# Patient Record
Sex: Female | Born: 1995 | Race: White | Hispanic: No | Marital: Single | Smoking: Never smoker
Health system: Southern US, Community
[De-identification: ages and names within clinical notes are randomized; demographics above are authoritative.]

## PROBLEM LIST (undated history)

## (undated) ENCOUNTER — Emergency Department (HOSPITAL_BASED_OUTPATIENT_CLINIC_OR_DEPARTMENT_OTHER): Admission: EM | Payer: BC Managed Care – PPO

## (undated) DIAGNOSIS — M797 Fibromyalgia: Secondary | ICD-10-CM

## (undated) DIAGNOSIS — F32A Depression, unspecified: Secondary | ICD-10-CM

## (undated) DIAGNOSIS — F419 Anxiety disorder, unspecified: Secondary | ICD-10-CM

## (undated) DIAGNOSIS — F329 Major depressive disorder, single episode, unspecified: Secondary | ICD-10-CM

## (undated) HISTORY — DX: Fibromyalgia: M79.7

---
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
## 2017-04-08 DIAGNOSIS — Z6820 Body mass index (BMI) 20.0-20.9, adult: Secondary | ICD-10-CM | POA: Diagnosis not present

## 2017-04-08 DIAGNOSIS — Z01419 Encounter for gynecological examination (general) (routine) without abnormal findings: Secondary | ICD-10-CM | POA: Diagnosis not present

## 2017-04-08 DIAGNOSIS — Z113 Encounter for screening for infections with a predominantly sexual mode of transmission: Secondary | ICD-10-CM | POA: Diagnosis not present

## 2017-04-08 DIAGNOSIS — Z30018 Encounter for initial prescription of other contraceptives: Secondary | ICD-10-CM | POA: Diagnosis not present

## 2017-04-08 DIAGNOSIS — Z118 Encounter for screening for other infectious and parasitic diseases: Secondary | ICD-10-CM | POA: Diagnosis not present

## 2017-09-09 DIAGNOSIS — F411 Generalized anxiety disorder: Secondary | ICD-10-CM | POA: Diagnosis not present

## 2017-09-09 DIAGNOSIS — F902 Attention-deficit hyperactivity disorder, combined type: Secondary | ICD-10-CM | POA: Diagnosis not present

## 2017-09-09 DIAGNOSIS — F3181 Bipolar II disorder: Secondary | ICD-10-CM | POA: Diagnosis not present

## 2017-11-27 DIAGNOSIS — F902 Attention-deficit hyperactivity disorder, combined type: Secondary | ICD-10-CM | POA: Diagnosis not present

## 2017-11-27 DIAGNOSIS — F3181 Bipolar II disorder: Secondary | ICD-10-CM | POA: Diagnosis not present

## 2017-11-27 DIAGNOSIS — F411 Generalized anxiety disorder: Secondary | ICD-10-CM | POA: Diagnosis not present

## 2018-01-23 DIAGNOSIS — F902 Attention-deficit hyperactivity disorder, combined type: Secondary | ICD-10-CM | POA: Diagnosis not present

## 2018-01-23 DIAGNOSIS — F411 Generalized anxiety disorder: Secondary | ICD-10-CM | POA: Diagnosis not present

## 2018-01-23 DIAGNOSIS — F3181 Bipolar II disorder: Secondary | ICD-10-CM | POA: Diagnosis not present

## 2018-06-24 DIAGNOSIS — F902 Attention-deficit hyperactivity disorder, combined type: Secondary | ICD-10-CM | POA: Diagnosis not present

## 2018-06-24 DIAGNOSIS — F3181 Bipolar II disorder: Secondary | ICD-10-CM | POA: Diagnosis not present

## 2018-06-24 DIAGNOSIS — F411 Generalized anxiety disorder: Secondary | ICD-10-CM | POA: Diagnosis not present

## 2018-08-12 DIAGNOSIS — Z0000 Encounter for general adult medical examination without abnormal findings: Secondary | ICD-10-CM | POA: Diagnosis not present

## 2018-08-12 DIAGNOSIS — M545 Low back pain: Secondary | ICD-10-CM | POA: Diagnosis not present

## 2018-08-12 DIAGNOSIS — Z309 Encounter for contraceptive management, unspecified: Secondary | ICD-10-CM | POA: Diagnosis not present

## 2018-08-12 DIAGNOSIS — Z130 Encounter for screening for diseases of the blood and blood-forming organs and certain disorders involving the immune mechanism: Secondary | ICD-10-CM | POA: Diagnosis not present

## 2018-08-12 DIAGNOSIS — Z13228 Encounter for screening for other metabolic disorders: Secondary | ICD-10-CM | POA: Diagnosis not present

## 2018-08-12 DIAGNOSIS — F3342 Major depressive disorder, recurrent, in full remission: Secondary | ICD-10-CM | POA: Diagnosis not present

## 2018-08-12 DIAGNOSIS — Z113 Encounter for screening for infections with a predominantly sexual mode of transmission: Secondary | ICD-10-CM | POA: Diagnosis not present

## 2018-08-12 DIAGNOSIS — J4520 Mild intermittent asthma, uncomplicated: Secondary | ICD-10-CM | POA: Diagnosis not present

## 2018-08-12 DIAGNOSIS — F988 Other specified behavioral and emotional disorders with onset usually occurring in childhood and adolescence: Secondary | ICD-10-CM | POA: Diagnosis not present

## 2018-09-22 DIAGNOSIS — F902 Attention-deficit hyperactivity disorder, combined type: Secondary | ICD-10-CM | POA: Diagnosis not present

## 2018-09-22 DIAGNOSIS — F3181 Bipolar II disorder: Secondary | ICD-10-CM | POA: Diagnosis not present

## 2018-09-22 DIAGNOSIS — F411 Generalized anxiety disorder: Secondary | ICD-10-CM | POA: Diagnosis not present

## 2018-12-15 DIAGNOSIS — F902 Attention-deficit hyperactivity disorder, combined type: Secondary | ICD-10-CM | POA: Diagnosis not present

## 2018-12-15 DIAGNOSIS — F3181 Bipolar II disorder: Secondary | ICD-10-CM | POA: Diagnosis not present

## 2018-12-15 DIAGNOSIS — F411 Generalized anxiety disorder: Secondary | ICD-10-CM | POA: Diagnosis not present

## 2019-01-12 DIAGNOSIS — F3181 Bipolar II disorder: Secondary | ICD-10-CM | POA: Diagnosis not present

## 2019-01-12 DIAGNOSIS — F411 Generalized anxiety disorder: Secondary | ICD-10-CM | POA: Diagnosis not present

## 2019-01-12 DIAGNOSIS — F902 Attention-deficit hyperactivity disorder, combined type: Secondary | ICD-10-CM | POA: Diagnosis not present

## 2019-07-06 ENCOUNTER — Other Ambulatory Visit: Payer: Self-pay

## 2019-07-06 ENCOUNTER — Telehealth (INDEPENDENT_AMBULATORY_CARE_PROVIDER_SITE_OTHER): Payer: BLUE CROSS/BLUE SHIELD | Admitting: Family Medicine

## 2019-07-06 DIAGNOSIS — Z30015 Encounter for initial prescription of vaginal ring hormonal contraceptive: Secondary | ICD-10-CM | POA: Diagnosis not present

## 2019-07-06 DIAGNOSIS — F988 Other specified behavioral and emotional disorders with onset usually occurring in childhood and adolescence: Secondary | ICD-10-CM | POA: Diagnosis not present

## 2019-07-06 DIAGNOSIS — F411 Generalized anxiety disorder: Secondary | ICD-10-CM

## 2019-07-06 MED ORDER — ETONOGESTREL-ETHINYL ESTRADIOL 0.12-0.015 MG/24HR VA RING: VAGINAL_RING | VAGINAL | 3 refills | Status: DC

## 2019-07-07 DIAGNOSIS — F411 Generalized anxiety disorder: Secondary | ICD-10-CM | POA: Insufficient documentation

## 2019-07-07 DIAGNOSIS — F988 Other specified behavioral and emotional disorders with onset usually occurring in childhood and adolescence: Secondary | ICD-10-CM | POA: Insufficient documentation

## 2019-07-07 NOTE — Progress Notes (Signed)
Virtual Visit via Video Note   I connected with Mary Bright on 07/06/19 by a video enabled telemedicine application and verified that I am speaking with the correct person using two identifiers.  Location patient: home Location provider:work office Persons participating in the virtual visit: patient, provider  I discussed the limitations of evaluation and management by telemedicine and the availability of in person appointments. The patient expressed understanding and agreed to proceed.   HPI: Mary Bright is a 23 yo female with Hx of depression,GAD, asthma,and ADD who is establishing care today. Former pt of Dr Shawna Orleans. Last CPE 08/12/2018 Today she is requesting refills on Nuvaring. She has used Nuvaring intermittently since age 57.It has helped with dysmenorrhea and for contraception.  She has tolerated medication well.   Sexually active. LMP a week ago.  She has had 2 pap smears, denies abnormalities.  She follows with psychiatrist , currently she is on Prozac and Adderall XR 15 mg   ROS: See pertinent positives and negatives per HPI.  Past Medical History:  Diagnosis Date  . Anxiety   . Asthma   . Depression    History of psych admission at age 89    Past Surgical History:  Procedure Laterality Date  . HAND SURGERY Right 2015    Family History  Problem Relation Age of Onset  . Depression Mother   . Bipolar disorder Maternal Aunt   . Bipolar disorder Maternal Grandfather   . Alcohol abuse Paternal Grandfather     Social History   Socioeconomic History  . Marital status: Single    Spouse name: Not on file  . Number of children: Not on file  . Years of education: Not on file  . Highest education level: Not on file  Occupational History  . Not on file  Social Needs  . Financial resource strain: Not on file  . Food insecurity    Worry: Not on file    Inability: Not on file  . Transportation needs    Medical: Not on file    Non-medical: Not on file  Tobacco Use  .  Smoking status: Never Smoker  Substance and Sexual Activity  . Alcohol use: Yes  . Drug use: No  . Sexual activity: Not on file  Lifestyle  . Physical activity    Days per week: Not on file    Minutes per session: Not on file  . Stress: Not on file  Relationships  . Social Herbalist on phone: Not on file    Gets together: Not on file    Attends religious service: Not on file    Active member of club or organization: Not on file    Attends meetings of clubs or organizations: Not on file    Relationship status: Not on file  . Intimate partner violence    Fear of current or ex partner: Not on file    Emotionally abused: Not on file    Physically abused: Not on file    Forced sexual activity: Not on file  Other Topics Concern  . 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  albuterol (PROVENTIL HFA;VENTOLIN HFA) 108 (90 BASE) MCG/ACT inhaler, Inhale 2 puffs into the lungs every 6 (six) hours as needed for wheezing or shortness of breath., Disp: 1 Inhaler, Rfl: 3 .  albuterol (PROVENTIL) (  2.5 MG/3ML) 0.083% nebulizer solution, Take 3 mLs (2.5 mg total) by nebulization every 6 (six) hours as needed for wheezing or shortness of breath., Disp: 150 mL, Rfl: 1 .  etonogestrel-ethinyl estradiol (NUVARING) 0.12-0.015 MG/24HR vaginal ring, Insert vaginally and leave in place for 3 consecutive weeks, then remove for 1 week., Disp: 4 each, Rfl: 3  EXAM:  VITALS per patient if applicable:LMP 06/30/2019   GENERAL: alert, oriented, appears well and in no acute distress  HEENT: atraumatic, conjunctiva clear, no obvious abnormalities on inspection.  LUNGS: on inspection no signs of respiratory distress, breathing rate appears normal, no obvious gross SOB, gasping or wheezing  CV: no obvious cyanosis  MS: moves all visible extremities without  noticeable abnormality  PSYCH/NEURO: pleasant and cooperative, no obvious depression or anxiety, speech and thought processing grossly intact  ASSESSMENT AND PLAN:  Discussed the following assessment and plan:  Encounter for initial prescription of vaginal ring hormonal contraceptive - Plan: etonogestrel-ethinyl estradiol (NUVARING) 0.12-0.015 MG/24HR vaginal ring She understands side effects of medication, she has tolerated it well.  Reporting pap smear as current. F/U annually.  GAD (generalized anxiety disorder) No changes in Prozac dose. Continue following with psychiatrist.  Attention deficit disorder, unspecified hyperactivity presence Following with psychiatrist.    I discussed the assessment and treatment plan with the patient. She was provided an opportunity to ask questions and all were answered. She agreed with the plan and demonstrated an understanding of the instructions.     Return in about 1 year (around 07/05/2020) for cpe.    Tyshay Adee [place], MD

## 2019-07-22 DIAGNOSIS — Z20828 Contact with and (suspected) exposure to other viral communicable diseases: Secondary | ICD-10-CM | POA: Diagnosis not present

## 2019-09-17 ENCOUNTER — Other Ambulatory Visit: Payer: Self-pay

## 2019-09-17 ENCOUNTER — Ambulatory Visit: Payer: Self-pay

## 2019-09-17 ENCOUNTER — Encounter (HOSPITAL_COMMUNITY): Payer: Self-pay

## 2019-09-17 ENCOUNTER — Ambulatory Visit (HOSPITAL_COMMUNITY)
Admission: EM | Admit: 2019-09-17 | Discharge: 2019-09-17 | Disposition: A | Discharge status: Home / Self Care | Payer: BC Managed Care – PPO | Attending: Internal Medicine | Admitting: Internal Medicine

## 2019-09-17 DIAGNOSIS — J4521 Mild intermittent asthma with (acute) exacerbation: Secondary | ICD-10-CM

## 2019-09-17 DIAGNOSIS — U071 COVID-19: Secondary | ICD-10-CM | POA: Diagnosis not present

## 2019-09-17 MED ORDER — ALBUTEROL SULFATE (2.5 MG/3ML) 0.083% IN NEBU: 2.5000 mg | INHALATION_SOLUTION | Freq: Four times a day (QID) | RESPIRATORY_TRACT | 1 refills | Status: DC | PRN

## 2019-09-17 MED ORDER — BENZONATATE 100 MG PO CAPS: 100.0000 mg | ORAL_CAPSULE | Freq: Three times a day (TID) | ORAL | 0 refills | Status: DC

## 2019-09-17 NOTE — Telephone Encounter (Signed)
Patient has arrived [facility]

## 2019-09-17 NOTE — Telephone Encounter (Signed)
Incoming call from Patient with a complaint of SOB.  Began  2 night ago . State ?SO?B is constant.  Reports cough and runny nose. Patient is on Isolation for covid -19.   Reviewed protocol  Recommended that Patient be evaluated at Urgent care.  Patient voiced understanding.  Will go to Urgent care on [facility].       Reason for Disposition . [1] MODERATE longstanding difficulty breathing (e.g., speaks in phrases, SOB even at rest, pulse 100-120) AND [2] SAME as normal  Answer Assessment - Initial Assessment Questions 1. RESPIRATORY STATUS: "Describe your breathing?" (e.g., wheezing, shortness of breath, unable to speak, severe coughing)      Shortness of Breath 2. ONSET: "When did this breathing problem begin?"      2 night  3. PATTERN "Does the difficult breathing come and go, or has it been constant since it started?"      constant 4. SEVERITY: "How bad is your breathing?" (e.g., mild, moderate, severe)    - MILD: No SOB at rest, mild SOB with walking, speaks normally in sentences, can lay down, no retractions, pulse < 100.    - MODERATE: SOB at rest, SOB with minimal exertion and prefers to sit, cannot lie down flat, speaks in phrases, mild retractions, audible wheezing, pulse 100-120.    - SEVERE: Very SOB at rest, speaks in single words, struggling to breathe, sitting hunched forward, retractions, pulse > 120     moderate 5. RECURRENT SYMPTOM: "Have you had difficulty breathing before?" If so, ask: "When was the last time?" and "What happened that time?"       6. CARDIAC HISTORY: "Do you have any history of heart disease?" (e.g., heart attack, angina, bypass surgery, angioplasty)      Denies  7. LUNG HISTORY: "Do you have any history of lung disease?"  (e.g., pulmonary embolus, asthma, emphysema)   asthma 8. CAUSE: "What do you think is causing the breathing problem?"      *No Answer* 9. OTHER SYMPTOMS: "Do you have any other symptoms? (e.g., dizziness, runny nose, cough, chest  pain, fever)     Runny nose cough 10. PREGNANCY: "Is there any chance you are pregnant?" "When was your last menstrual period?"      [id number]. TRAVEL: "Have you traveled out of the country in the last month?" (e.g., travel history, exposures)       Denies  Protocols used: BREATHING DIFFICULTY-A-AH

## 2019-09-17 NOTE — ED Triage Notes (Signed)
Patient presents to Urgent Care with complaints of shortness of breath since testing positive for COVID 3 days ago. Patient reports she has an inhaler and nebulizer machine at home but is out of nebulizer medicine,states a refill would be helpful.

## 2019-09-17 NOTE — Telephone Encounter (Signed)
See note

## 2019-09-17 NOTE — ED Provider Notes (Signed)
[facility]    CSN: [id number] Arrival date & time: 09/17/19  1132      History   Chief Complaint Chief Complaint  Patient presents with  . Shortness of Breath    Positive COVID    HPI Mary Bright is a 23 y.o. female who was diagnosed with COVID-19 infection 3 days ago comes to urgent care with complaints of worsening shortness of breath, wheezing and a cough.  Patient denies any fever or chills.  No chest pain or chest pressure.  She has some chest tightness.  Patient currently uses albuterol nebulizer solutions and an albuterol inhaler.  She is run out of albuterol nebulizing solution.  No fever or chills.  No nausea, vomiting or diarrhea.  No loss of taste or smell.  Patient comes to urgent care to be reevaluated.   HPI  Past Medical History:  Diagnosis Date  . Anxiety   . Asthma   . Depression    History of psych admission at age 84    Patient Active Problem List   Diagnosis Date Noted  . GAD (generalized anxiety disorder) 07/07/2019  . ADD (attention deficit disorder) 07/07/2019  . Childhood asthma 03/31/2014  . Enlarged lymph node 03/31/2014  . Dysmenorrhea 03/31/2014  . Depression 12/17/2011    Past Surgical History:  Procedure Laterality Date  . HAND SURGERY Right 2015    OB History   No obstetric history on file.      Home Medications    Prior to Admission medications   Medication Sig Start Date End Date Taking? Authorizing Provider  albuterol (PROVENTIL HFA;VENTOLIN HFA) 108 (90 BASE) MCG/ACT inhaler Inhale 2 puffs into the lungs every 6 (six) hours as needed for wheezing or shortness of breath. 03/31/14   Artist Pais, Doe-Hyun R, DO  albuterol (PROVENTIL) (2.5 MG/3ML) 0.083% nebulizer solution Take 3 mLs (2.5 mg total) by nebulization every 6 (six) hours as needed for wheezing or shortness of breath. 09/17/19 10/17/19  LampteyBritta Mccreedy, MD  benzonatate (TESSALON) 100 MG capsule Take 1 capsule (100 mg total) by mouth every 8 (eight) hours. 09/17/19    Merrilee Jansky, MD  etonogestrel-ethinyl estradiol (NUVARING) 0.12-0.015 MG/24HR vaginal ring Insert vaginally and leave in place for 3 consecutive weeks, then remove for 1 week. 07/06/19   [place], Betty G, MD    Family History Family History  Problem Relation Age of Onset  . Depression Mother   . Bipolar disorder Maternal Aunt   . Bipolar disorder Maternal Grandfather   . Alcohol abuse Paternal Grandfather   . Healthy Father     Social History Social History   Tobacco Use  . Smoking status: Never Smoker  . Smokeless tobacco: Never Used  Substance Use Topics  . Alcohol use: Yes    Comment: socially  . Drug use: No     Allergies   Patient has no known allergies.   Review of Systems Review of Systems  Constitutional: Positive for fatigue. Negative for activity change, chills and fever.  HENT: Negative.   Respiratory: Positive for cough, shortness of breath and wheezing.   Cardiovascular: Negative for chest pain and palpitations.  Gastrointestinal: Negative for diarrhea, nausea and vomiting.  Genitourinary: Negative.   Musculoskeletal: Negative for arthralgias, gait problem and myalgias.  Skin: Negative.   Allergic/Immunologic: Negative for environmental allergies and immunocompromised state.  Neurological: Positive for weakness. Negative for dizziness, light-headedness and headaches.  Psychiatric/Behavioral: Negative for confusion and decreased concentration.     Physical Exam Triage  Vital Signs ED Triage Vitals  Enc Vitals Group     BP 09/17/19 1229 99/69     Pulse Rate 09/17/19 1229 65     Resp 09/17/19 1229 16     Temp 09/17/19 1229 98.9 F (37.2 C)     Temp Source 09/17/19 1229 Oral     SpO2 09/17/19 1229 100 %     Weight --      Height --      Head Circumference --      Peak Flow --      Pain Score 09/17/19 1227 0     Pain Loc --      Pain Edu? --      Excl. in GC? --    No data found.  Updated Vital Signs BP 99/69 (BP Location: Right Arm)    Pulse 65   Temp 98.9 F (37.2 C) (Oral)   Resp 16   SpO2 100%   Visual Acuity Right Eye Distance:   Left Eye Distance:   Bilateral Distance:    Right Eye Near:   Left Eye Near:    Bilateral Near:     Physical Exam Constitutional:      General: She is not in acute distress.    Appearance: She is well-developed. She is ill-appearing.  HENT:     Mouth/Throat:     Mouth: Mucous membranes are moist.     Pharynx: No oropharyngeal exudate.  Neck:     Musculoskeletal: Normal range of motion.     Thyroid: No thyromegaly.  Cardiovascular:     Rate and Rhythm: Normal rate and regular rhythm.  Pulmonary:     Effort: Pulmonary effort is normal. No accessory muscle usage or respiratory distress.     Breath sounds: No decreased breath sounds, wheezing, rhonchi or rales.  Chest:     Chest wall: No tenderness.  Abdominal:     Palpations: Abdomen is soft. There is no hepatomegaly or mass.     Tenderness: There is no abdominal tenderness.  Musculoskeletal: Normal range of motion.  Lymphadenopathy:     Cervical: No cervical adenopathy.  Skin:    General: Skin is warm.     Capillary Refill: Capillary refill takes less than 2 seconds.     Findings: No erythema.  Neurological:     General: No focal deficit present.     Mental Status: She is alert.      UC Treatments / Results  Labs (all labs ordered are listed, but only abnormal results are displayed) Labs Reviewed - No data to display  EKG   Radiology No results found.  Procedures Procedures (including critical care time)  Medications Ordered in UC Medications - No data to display  Initial Impression / Assessment and Plan / UC Course  I have reviewed the triage vital signs and the nursing notes.  Pertinent labs & imaging results that were available during my care of the patient were reviewed by me and considered in my medical decision making (see chart for details).     1.  COVID-19 infection: Supportive care  Patient continues to quarantine If patient develops any worsening symptoms she is advised to return to urgent care to be reevaluated.  2.  Mild intermittent asthma with exacerbation secondary to COVID-19 infection: Albuterol nebulizer solutions every 6 hours as needed. Tessalon Perles as needed for cough. Patient has mild exacerbation and no indication for steroids at this time.  No wheezing is appreciated on pulmonary exam. Final Clinical  Impressions(s) / UC Diagnoses   Final diagnoses:  WKGSU-[id number] virus infection  Mild intermittent asthma with acute exacerbation   Discharge Instructions   None    ED Prescriptions    Medication Sig Dispense Auth. Provider   albuterol (PROVENTIL) (2.5 MG/3ML) 0.083% nebulizer solution Take 3 mLs (2.5 mg total) by nebulization every 6 (six) hours as needed for wheezing or shortness of breath. 150 mL Ryett Hamman, Myrene Galas, MD   benzonatate (TESSALON) 100 MG capsule Take 1 capsule (100 mg total) by mouth every 8 (eight) hours. 30 capsule Kanai Hilger, Myrene Galas, MD     PDMP not reviewed this encounter.   Chase Picket, MD 09/17/19 1902

## 2019-09-21 NOTE — Telephone Encounter (Signed)
Message sent to Dr. Jordan for review. 

## 2019-09-21 NOTE — Telephone Encounter (Signed)
Pt was evaluated in the ER on 09/17/19. Mary Feltus Martinique, MD

## 2019-09-25 DIAGNOSIS — R438 Other disturbances of smell and taste: Secondary | ICD-10-CM | POA: Diagnosis not present

## 2019-09-25 DIAGNOSIS — Z20828 Contact with and (suspected) exposure to other viral communicable diseases: Secondary | ICD-10-CM | POA: Diagnosis not present

## 2019-11-26 DIAGNOSIS — F3181 Bipolar II disorder: Secondary | ICD-10-CM | POA: Diagnosis not present

## 2019-11-26 DIAGNOSIS — F902 Attention-deficit hyperactivity disorder, combined type: Secondary | ICD-10-CM | POA: Diagnosis not present

## 2019-11-26 DIAGNOSIS — F411 Generalized anxiety disorder: Secondary | ICD-10-CM | POA: Diagnosis not present

## 2020-01-20 DIAGNOSIS — F902 Attention-deficit hyperactivity disorder, combined type: Secondary | ICD-10-CM | POA: Diagnosis not present

## 2020-01-20 DIAGNOSIS — F3181 Bipolar II disorder: Secondary | ICD-10-CM | POA: Diagnosis not present

## 2020-01-20 DIAGNOSIS — F411 Generalized anxiety disorder: Secondary | ICD-10-CM | POA: Diagnosis not present

## 2020-02-16 DIAGNOSIS — F902 Attention-deficit hyperactivity disorder, combined type: Secondary | ICD-10-CM | POA: Diagnosis not present

## 2020-02-16 DIAGNOSIS — F3181 Bipolar II disorder: Secondary | ICD-10-CM | POA: Diagnosis not present

## 2020-02-16 DIAGNOSIS — F411 Generalized anxiety disorder: Secondary | ICD-10-CM | POA: Diagnosis not present

## 2020-03-17 DIAGNOSIS — F902 Attention-deficit hyperactivity disorder, combined type: Secondary | ICD-10-CM | POA: Diagnosis not present

## 2020-03-17 DIAGNOSIS — F3181 Bipolar II disorder: Secondary | ICD-10-CM | POA: Diagnosis not present

## 2020-03-17 DIAGNOSIS — F411 Generalized anxiety disorder: Secondary | ICD-10-CM | POA: Diagnosis not present

## 2020-06-28 ENCOUNTER — Encounter: Payer: Self-pay | Admitting: Family Medicine

## 2020-06-28 ENCOUNTER — Other Ambulatory Visit: Payer: Self-pay

## 2020-06-28 ENCOUNTER — Ambulatory Visit (INDEPENDENT_AMBULATORY_CARE_PROVIDER_SITE_OTHER): Payer: BC Managed Care – PPO | Admitting: Family Medicine

## 2020-06-28 VITALS — BP 118/70 | HR 61 | Temp 98.5°F | Resp 12 | Ht 68.25 in | Wt 134.4 lb

## 2020-06-28 DIAGNOSIS — G8929 Other chronic pain: Secondary | ICD-10-CM

## 2020-06-28 DIAGNOSIS — M549 Dorsalgia, unspecified: Secondary | ICD-10-CM

## 2020-06-28 DIAGNOSIS — Z30015 Encounter for initial prescription of vaginal ring hormonal contraceptive: Secondary | ICD-10-CM

## 2020-06-28 DIAGNOSIS — M545 Low back pain, unspecified: Secondary | ICD-10-CM

## 2020-06-28 MED ORDER — TIZANIDINE HCL 4 MG PO TABS: 4.0000 mg | ORAL_TABLET | Freq: Three times a day (TID) | ORAL | 1 refills | Status: DC | PRN

## 2020-06-28 MED ORDER — ETONOGESTREL-ETHINYL ESTRADIOL 0.12-0.015 MG/24HR VA RING: VAGINAL_RING | VAGINAL | 3 refills | Status: DC

## 2020-06-28 NOTE — Progress Notes (Signed)
Chief Complaint  Patient presents with   Back Pain    ongoing since middle school   Medication Refill    nuvaring   HPI: Mary Bright is a 24 y.o. female, who is here today complaining of worsening left-sided lower and upper back pain for th past few months.  She has had back problems since middle school, 24 yo.  She denies any recent injury or unusual level of activity. At work she does some lifting but no more than usual.  Pain is constant, no radiated, achy and dull like, 7/10 max in intensity, with no associated UE/LE numbness or tingling, urinary incontinence or retention, stool incontinence, or saddle anesthesia.  Exacerbated by lifting and certain movements. Alleviated by rest. No rash or edema on area, fever, chills,abnormal wt loss,abdominal pain,N/V, or changes in bowel habits. Pain does not interfere with sleep.  OTC medications: Advil.  She also needs refills on Nuvaring. She has been on Nuvaring on/off for about 6 years. Medication helps with contraception and dysmenorrhea. She has had pap smears in the past and reported as normal.  Review of Systems  Constitutional: Negative for activity change, appetite change and fatigue.  HENT: Negative for mouth sores and sore throat.   Respiratory: Negative for cough, shortness of breath and wheezing.   Cardiovascular: Negative for chest pain, palpitations and leg swelling.  Genitourinary: Negative for decreased urine volume, dysuria and hematuria.  Musculoskeletal: Negative for gait problem and joint swelling.  Neurological: Negative for syncope and weakness.  Rest see pertinent positives and negatives per HPI.  No current outpatient medications on file prior to visit.   No current facility-administered medications on file prior to visit.   Past Medical History:  Diagnosis Date   Anxiety    Asthma    Depression    History of psych admission at age 49   No Known Allergies  Social History    Socioeconomic History   Marital status: Single    Spouse name: Not on file   Number of children: Not on file   Years of education: Not on file   Highest education level: Not on file  Occupational History   Not on file  Tobacco Use   Smoking status: Never Smoker   Smokeless tobacco: Never Used  Vaping Use   Vaping Use: Never used  Substance and Sexual Activity   Alcohol use: Yes    Comment: socially   Drug use: No   Sexual activity: Not on file  Other Topics Concern   Not on file  Social History Narrative   Student at [employer]   Good grades   Extracurricular activities - drama / theatre club   Social Determinants of Health   Financial Resource Strain:    Difficulty of Paying Living Expenses: Not on file  Food Insecurity:    Worried About Programme researcher, broadcasting/film/video in the Last Year: Not on file   [employer] of Food in the Last Year: Not on file  Transportation Needs:    Lack of Transportation (Medical): Not on file   Lack of Transportation (Non-Medical): Not on file  Physical Activity:    Days of Exercise per Week: Not on file   Minutes of Exercise per Session: Not on file  Stress:    Feeling of Stress : Not on file  Social Connections:    Frequency of Communication with Friends and Family: Not on file   Frequency of Social Gatherings with Friends and Family:  Not on file   Attends Religious Services: Not on file   Active Member of Clubs or Organizations: Not on file   Attends Club or Organization Meetings: Not on file   Marital Status: Not on file   Vitals:   06/28/20 1537  BP: 118/70  Pulse: 61  Resp: 12  Temp: 98.5 F (36.9 C)  SpO2: 100%   Body mass index is 20.28 kg/m.  Physical Exam Vitals and nursing note reviewed.  Constitutional:      General: She is not in acute distress.    Appearance: She is well-developed and normal weight. She is not ill-appearing.  HENT:     Head: Normocephalic and atraumatic.  Eyes:      Conjunctiva/sclera: Conjunctivae normal.  Pulmonary:     Effort: Pulmonary effort is normal. No respiratory distress.     Breath sounds: Normal breath sounds.  Musculoskeletal:     Cervical back: Spasms and tenderness present. No edema, erythema or bony tenderness. Normal range of motion.     Lumbar back: Tenderness present. No bony tenderness. Negative right straight leg raise test and negative left straight leg raise test.       Back:  Lymphadenopathy:     Cervical: No cervical adenopathy.  Skin:    General: Skin is warm.     Findings: No erythema or rash.  Neurological:     Mental Status: She is alert and oriented to person, place, and time.     Coordination: Coordination normal.     Deep Tendon Reflexes:     Reflex Scores:      Patellar reflexes are 2+ on the right side and 2+ on the left side. Psychiatric:     Comments: Well groomed, good eye contact.   ASSESSMENT AND PLAN:  Marlinda was seen today for back pain and medication refill.  Diagnoses and all orders for this visit:  Chronic left-sided low back pain without sciatica Problem is chronic a getting worse due to activities at work. Advised to follow ergonomic recommendations for specific activities at work. PT will be arranged.  -     tiZANidine (ZANAFLEX) 4 MG tablet; Take 1 tablet (4 mg total) by mouth every 8 (eight) hours as needed for muscle spasms. -     Ambulatory referral to Physical Therapy  Upper back pain on left side Chronic. We discussed some side effects of muscle relaxants. Continue Advil 200-400 mg 2-3 times per day as needed and with food. Tylenol 500 mg 3-4 tabs daily if needed.  -     tiZANidine (ZANAFLEX) 4 MG tablet; Take 1 tablet (4 mg total) by mouth every 8 (eight) hours as needed for muscle spasms. -     Ambulatory referral to Physical Therapy  Encounter for initial prescription of vaginal ring hormonal contraceptive Tolerating medication well for years. She understands side  effects. She is due for pap smear.  -     etonogestrel-ethinyl estradiol (NUVARING) 0.12-0.015 MG/24HR vaginal ring; Insert vaginally and leave in place for 3 consecutive weeks, then remove for 1 week.   Return if symptoms worsen or fail to improve, for Needs gyn routine visit/pap smear..   Jeniece Hannis G. [place], MD  [facility] [place] office.  A few things to remember from today's visit:   Chronic left-sided low back pain without sciatica - Plan: tiZANidine (ZANAFLEX) 4 MG tablet, Ambulatory referral to Physical Therapy  Encounter for initial prescription of vaginal ring hormonal contraceptive - Plan: etonogestrel-ethinyl estradiol (NUVARING) 0.12-0.015 MG/24HR vaginal  ring  Upper back pain on left side - Plan: tiZANidine (ZANAFLEX) 4 MG tablet, Ambulatory referral to Physical Therapy  Try Zanaflex, it causes drowsiness. PT will be arranged. Alvil or Tylenol over the counter to continue.  If you need refills please call your pharmacy. Do not use My Chart to request refills or for acute issues that need immediate attention.    Please be sure medication list is accurate. If a new problem present, please set up appointment sooner than planned today.

## 2020-06-28 NOTE — Patient Instructions (Addendum)
A few things to remember from today's visit:   Chronic left-sided low back pain without sciatica - Plan: tiZANidine (ZANAFLEX) 4 MG tablet, Ambulatory referral to Physical Therapy  Encounter for initial prescription of vaginal ring hormonal contraceptive - Plan: etonogestrel-ethinyl estradiol (NUVARING) 0.12-0.015 MG/24HR vaginal ring  Upper back pain on left side - Plan: tiZANidine (ZANAFLEX) 4 MG tablet, Ambulatory referral to Physical Therapy  Try Zanaflex, it causes drowsiness. PT will be arranged. Alvil or Tylenol over the counter to continue.  If you need refills please call your pharmacy. Do not use My Chart to request refills or for acute issues that need immediate attention.    Please be sure medication list is accurate. If a new problem present, please set up appointment sooner than planned today.

## 2020-07-04 ENCOUNTER — Other Ambulatory Visit (HOSPITAL_COMMUNITY)
Admission: RE | Admit: 2020-07-04 | Discharge: 2020-07-04 | Disposition: A | Discharge status: Home / Self Care | Payer: BC Managed Care – PPO | Source: Ambulatory Visit | Attending: Family Medicine | Admitting: Family Medicine

## 2020-07-04 ENCOUNTER — Ambulatory Visit (INDEPENDENT_AMBULATORY_CARE_PROVIDER_SITE_OTHER): Payer: BC Managed Care – PPO | Admitting: Family Medicine

## 2020-07-04 ENCOUNTER — Encounter: Payer: Self-pay | Admitting: Family Medicine

## 2020-07-04 VITALS — BP 122/80 | HR 60 | Resp 16 | Ht 68.25 in | Wt 134.4 lb

## 2020-07-04 DIAGNOSIS — Z124 Encounter for screening for malignant neoplasm of cervix: Secondary | ICD-10-CM | POA: Diagnosis not present

## 2020-07-04 DIAGNOSIS — Z01419 Encounter for gynecological examination (general) (routine) without abnormal findings: Secondary | ICD-10-CM | POA: Insufficient documentation

## 2020-07-04 DIAGNOSIS — Z23 Encounter for immunization: Secondary | ICD-10-CM

## 2020-07-04 DIAGNOSIS — F39 Unspecified mood [affective] disorder: Secondary | ICD-10-CM

## 2020-07-04 NOTE — Patient Instructions (Signed)
Today you have you routine preventive visit. A few things to remember from today's visit:  Screening for malignant neoplasm of cervix  If you need refills please call your pharmacy. Do not use My Chart to request refills or for acute issues that need immediate attention.    Please be sure medication list is accurate. If a new problem present, please set up appointment sooner than planned today.  At least 150 minutes of moderate exercise per week, daily brisk walking for 15-30 min is a good exercise option. Healthy diet low in saturated (animal) fats and sweets and consisting of fresh fruits and vegetables, lean meats such as fish and white chicken and whole grains.  These are some of recommendations for screening depending of age and risk factors:  - Vaccines:  Tdap vaccine every 10 years.  Shingles vaccine recommended at age 66, could be given after 23 years of age but not sure about insurance coverage.   Pneumonia vaccines: Pneumovax at 65. Sometimes Pneumovax is giving earlier if history of smoking, lung disease,diabetes,kidney disease among some.  Screening for diabetes at age 56 and every 3 years.  Cervical cancer prevention:  Pap smear starts at 23 years of age and continues periodically until 23 years old in low risk women. Pap smear every 3 years between 64 and 63 years old. Pap smear every 3-5 years between women 30 and older if pap smear negative and HPV screening negative.   -Breast cancer: Mammogram: There is disagreement between experts about when to start screening in low risk asymptomatic female but recent recommendations are to start screening at 78 and not later than 23 years old , every 1-2 years and after 23 yo q 2 years. Screening is recommended until 23 years old but some women can continue screening depending of healthy issues.  Colon cancer screening: Has been recently changed to 23 yo. Insurance may not cover until you are 23 years old. Screening is  recommended until 23 years old.  Cholesterol disorder screening at age 82 and every 3 years.  Also recommended:  1. Dental visit- Brush and floss your teeth twice daily; visit your dentist twice a year. 2. Eye doctor- Get an eye exam at least every 2 years. 3. Helmet use- Always wear a helmet when riding a bicycle, motorcycle, rollerblading or skateboarding. 4. Safe sex- If you may be exposed to sexually transmitted infections, use a condom. 5. Seat belts- Seat belts can save your live; always wear one. 6. Smoke/Carbon Monoxide detectors- These detectors need to be installed on the appropriate level of your home. Replace batteries at least once a year. 7. Skin cancer- When out in the sun please cover up and use sunscreen 15 SPF or higher. 8. Violence- If anyone is threatening or hurting you, please tell your healthcare provider.  9. Drink alcohol in moderation- Limit alcohol intake to one drink or less per day. Never drink and drive. 10. Calcium supplementation 1000 to 1200 mg daily, ideally through your diet.  Vitamin D supplementation 800 units daily.

## 2020-07-04 NOTE — Progress Notes (Addendum)
Chief Complaint  Patient presents with   Gynecologic Exam    HPI: Mary Bright is a 24 y.o. female, who is here today for her routine gyn examination. CPE 08/12/2018  Hx of mood disorder and back pain. Depression/anxiety/ADHD:she is not on pharmacologic treatment at this time. Not longer following with psychiatrist. Denies symptoms.  She lives with her boyfriend She does not exercise regularly but she tries to eat healthy. She has an active job.  Immunization History  Administered Date(s) Administered   DTaP 12/01/1997, 01/25/1998, 03/31/1998, 04/06/1999   HPV Quadrivalent 12/06/2009, 02/01/2010, 09/20/2011   Hepatitis A, Ped/Adol-2 Dose 01/06/1997, 03/31/2008   Hepatitis B, ped/adol Sep 29, 1997, 12/01/1997, 03/31/1998   IPV 12/01/1997, 01/25/1998, 10/06/1998, 06/14/2003   Influenza,inj,Quad PF,6+ Mos 07/04/2020   MMR 10/06/1998, 06/14/2003   Meningococcal Conjugate 12/06/2009   Pneumococcal Conjugate-13 04/06/1999, 09/25/1999   Tdap 06/11/2009, 07/04/2020    Last Pap smear:1-2 in the past, reported as normal. Menarche:13 LMP:06/26/20 G:0 STD: Treated for chlamydia infection in the past. Birth control:Nuvaring Hep C screening: Never  Review of Systems  Constitutional: Negative for appetite change, unusual fatigue and fever.  HENT: Negative for dental problem, hearing loss, mouth sores, sore throat, trouble swallowing and voice change.   Eyes: Negative for redness and visual disturbance.  Respiratory: Negative for cough, shortness of breath and wheezing.   Cardiovascular: Negative for chest pain and leg swelling.  Gastrointestinal: Negative for abdominal pain, nausea and vomiting.       No changes in bowel habits.  Endocrine: Negative for cold intolerance, heat intolerance, polydipsia, polyphagia and polyuria.  Genitourinary: Negative for decreased urine volume, dysuria, hematuria, vaginal bleeding and vaginal discharge.  Musculoskeletal: Negative for  arthralgias, gait problem and myalgias. + Back pain Skin: Negative for color change and rash.  Allergic/Immunologic: Positive for environmental allergies.  Neurological: Negative for syncope, weakness and headaches.  Hematological: Negative for adenopathy. Does not bruise/bleed easily.  Psychiatric/Behavioral: Negative for confusion and sleep disturbance. The patient is not nervous/anxious.   All other systems reviewed and are negative.   Current Outpatient Medications on File Prior to Visit  Medication Sig Dispense Refill   etonogestrel-ethinyl estradiol (NUVARING) 0.12-0.015 MG/24HR vaginal ring Insert vaginally and leave in place for 3 consecutive weeks, then remove for 1 week. 4 each 3   tiZANidine (ZANAFLEX) 4 MG tablet Take 1 tablet (4 mg total) by mouth every 8 (eight) hours as needed for muscle spasms. 30 tablet 1   No current facility-administered medications on file prior to visit.    Past Medical History:  Diagnosis Date   Anxiety    Asthma    Depression    History of psych admission at age 77    No Known Allergies  Social History   Socioeconomic History   Marital status: Single    Spouse name: Not on file   Number of children: Not on file   Years of education: Not on file   Highest education level: Not on file  Occupational History   Not on file  Tobacco Use   Smoking status: Never Smoker   Smokeless tobacco: Never Used  Vaping Use   Vaping Use: Never used  Substance and Sexual Activity   Alcohol use: Yes    Comment: socially   Drug use: No   Sexual activity: Not on file  Other Topics Concern   Not on file  Social History Narrative   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   Social Determinants of Health   Financial Resource Strain:    Difficulty of Paying Living Expenses: Not on file  Food Insecurity:    Worried About Charity fundraiser in the Last Year: Not on file    [employer] of Food in the Last Year: Not on file  Transportation Needs:    Lack of Transportation (Medical): Not on file   Lack of Transportation (Non-Medical): Not on file  Physical Activity:    Days of Exercise per Week: Not on file   Minutes of Exercise per Session: Not on file  Stress:    Feeling of Stress : Not on file  Social Connections:    Frequency of Communication with Friends and Family: Not on file   Frequency of Social Gatherings with Friends and Family: Not on file   Attends Religious Services: Not on file   Active Member of Clubs or Organizations: Not on file   Attends Archivist Meetings: Not on file   Marital Status: Not on file    Today's Vitals   07/04/20 1542  BP: 122/80  Pulse: 60  Resp: 16  SpO2: 100%  Height: 5' 8.25" (1.734 m)   Body mass index is 20.28 kg/m.  Wt Readings from Last 3 Encounters:  06/28/20 134 lb 6 oz (61 kg)  03/31/14 111 lb (50.3 kg) (30 %, Z= -0.53)*  12/14/11 120 lb (54.4 kg) (66 %, Z= 0.42)*   * Growth percentiles are based on CDC (Girls, 2-20 Years) data.    Physical Exam  Nursing note and vitals reviewed. Constitutional: She is oriented to person, place, and time. She appears well-developed and well-nourished. No distress.  HENT:  Head: Normocephalic and atraumatic.  Ear: Gross hearing intact. Mouth/Throat: Uvula is midline, oropharynx is clear and moist and mucous membranes are normal.  Eyes: Pupils are equal, round, and reactive to light. Conjunctivae and EOM are normal.  Neck: No tracheal deviation present.  Cardiovascular: Normal rate and regular rhythm.  No murmur heard. Pulses:      Dorsalis pedis pulses are 2+ on the right side, and 2+ on the left side.  Respiratory: Effort normal and breath sounds normal. No respiratory distress.  GI: Soft. She exhibits no mass. There is no hepatomegaly. There is no tenderness.  Genitourinary: There is no rash, tenderness or lesion on the right labia. There is  no rash, tenderness or lesion on the left labia.  Vagina with no erythema, discharge, or bleeding.  Uterus is not enlarged and not tender. Cervix exhibits no motion tenderness, no discharge and no friability. Right adnexum displays no mass, no tenderness and no fullness. Left adnexum displays no mass, no tenderness and no fullness.    Genitourinary Comments: Breast: No masses, skin abnormalities, or nipple discharge appreciated bilateral. Pap smear collected. Musculoskeletal:        General: No edema.     Comments: No major deformity or signs of synovitis appreciated.  Lymphadenopathy:    She has no cervical or axillary adenopathy.       Right: No supraclavicular adenopathy present.       Left: No supraclavicular adenopathy present.  Neurological: She is alert and oriented to person, place, and time. She has normal strength. No cranial nerve deficit. Coordination and gait normal.  Reflex Scores:      Bicep reflexes are 2+ on the right side and 2+ on the left side.      Patellar reflexes are 2+ on  the right side and 2+ on the left side. Skin: Skin is warm. No rash noted. No erythema.  Psychiatric: She has a normal mood and affect.Well groomed,good eye contact.   ASSESSMENT AND PLAN:  Kaisyn was seen today for gynecologic exam.  Diagnoses and all orders for this visit: Orders Placed This Encounter  Procedures   Tdap vaccine greater than or equal to 7yo IM   Flu Vaccine QUAD 36+ mos IM    Encounter for annual routine gynecological examination We discussed the importance of regular physical activity and healthy diet for prevention of chronic diseases. Preventive guidelines reviewed. Vaccination updated. Prefers to hold on blood work (HCV screening). Next CPE in a year.  Screening for malignant neoplasm of cervix -     PAP [[place]]  Need for Tdap vaccination -     Tdap vaccine greater than or equal to 7yo IM  Need for influenza vaccination -     Flu Vaccine QUAD 36+ mos  IM  Mood disorder ([place]) Not longer on pharmacologic treatment. Denied having symptoms.  Return in 1 year (on 07/04/2021).   Betty Martinique, MD [facility]. [place] office.  Today you have you routine preventive visit. A few things to remember from today's visit:  Screening for malignant neoplasm of cervix  If you need refills please call your pharmacy. Do not use My Chart to request refills or for acute issues that need immediate attention.    Please be sure medication list is accurate. If a new problem present, please set up appointment sooner than planned today.  At least 150 minutes of moderate exercise per week, daily brisk walking for 15-30 min is a good exercise option. Healthy diet low in saturated (animal) fats and sweets and consisting of fresh fruits and vegetables, lean meats such as fish and white chicken and whole grains.  These are some of recommendations for screening depending of age and risk factors:  - Vaccines:  Tdap vaccine every 10 years.  Shingles vaccine recommended at age 66, could be given after 23 years of age but not sure about insurance coverage.   Pneumonia vaccines: Pneumovax at 45. Sometimes Pneumovax is giving earlier if history of smoking, lung disease,diabetes,kidney disease among some.  Screening for diabetes at age 70 and every 3 years.  Cervical cancer prevention:  Pap smear starts at 23 years of age and continues periodically until 23 years old in low risk women. Pap smear every 3 years between 49 and 23 years old. Pap smear every 3-5 years between women 39 and older if pap smear negative and HPV screening negative.   -Breast cancer: Mammogram: There is disagreement between experts about when to start screening in low risk asymptomatic female but recent recommendations are to start screening at 71 and not later than 23 years old , every 1-2 years and after 23 yo q 2 years. Screening is recommended until 23 years old but some  women can continue screening depending of healthy issues.  Colon cancer screening: Has been recently changed to 23 yo. Insurance may not cover until you are 23 years old. Screening is recommended until 23 years old.  Cholesterol disorder screening at age 31 and every 3 years.  Also recommended:  1. Dental visit- Brush and floss your teeth twice daily; visit your dentist twice a year. 2. Eye doctor- Get an eye exam at least every 2 years. 3. Helmet use- Always wear a helmet when riding a bicycle, motorcycle, rollerblading or skateboarding. 4. Safe  sex- If you may be exposed to sexually transmitted infections, use a condom. 5. Seat belts- Seat belts can save your live; always wear one. 6. Smoke/Carbon Monoxide detectors- These detectors need to be installed on the appropriate level of your home. Replace batteries at least once a year. 7. Skin cancer- When out in the sun please cover up and use sunscreen 15 SPF or higher. 8. Violence- If anyone is threatening or hurting you, please tell your healthcare provider.  9. Drink alcohol in moderation- Limit alcohol intake to one drink or less per day. Never drink and drive. 10. Calcium supplementation 1000 to 1200 mg daily, ideally through your diet.  Vitamin D supplementation 800 units daily.

## 2020-07-08 LAB — CYTOLOGY - PAP
Chlamydia: NEGATIVE
Comment: NEGATIVE
Comment: NEGATIVE
Comment: NORMAL
Diagnosis: NEGATIVE
Neisseria Gonorrhea: NEGATIVE
Trichomonas: NEGATIVE

## 2020-08-16 ENCOUNTER — Ambulatory Visit: Payer: BC Managed Care – PPO | Attending: Family Medicine

## 2020-08-16 ENCOUNTER — Other Ambulatory Visit: Payer: Self-pay

## 2020-08-16 DIAGNOSIS — G8929 Other chronic pain: Secondary | ICD-10-CM

## 2020-08-16 DIAGNOSIS — M545 Low back pain, unspecified: Secondary | ICD-10-CM | POA: Insufficient documentation

## 2020-08-16 DIAGNOSIS — M546 Pain in thoracic spine: Secondary | ICD-10-CM | POA: Diagnosis not present

## 2020-08-16 DIAGNOSIS — R252 Cramp and spasm: Secondary | ICD-10-CM | POA: Diagnosis not present

## 2020-08-16 NOTE — Therapy (Signed)
[facility] [address], [address] [place], [place], [address] Phone: [phone]   Fax:  [phone]  Physical Therapy Evaluation  Patient Details  Name: Mary Bright MRN: [id number] Date of Birth: 1996-07-09 Referring Provider (PT): [place], Betty, MD   Encounter Date: 08/16/2020   PT End of Session - 08/16/20 0839    Visit Number 1    Date for PT Re-Evaluation 10/11/20    Authorization Type BCBS    PT Start Time 0810   late   PT Stop Time 0842    PT Time Calculation (min) 32 min    Activity Tolerance Patient tolerated treatment well    Behavior During Therapy [facility] for tasks assessed/performed           Past Medical History:  Diagnosis Date  . Anxiety   . Asthma   . Depression    History of psych admission at age 2    Past Surgical History:  Procedure Laterality Date  . HAND SURGERY Right 2015    There were no vitals filed for this visit.    Subjective Assessment - 08/16/20 0813    Subjective Pt presents to PT with complaints of bil thoracic pain and Lt low back pain of a chronic nautre.  Pt reports that this is a chronic problem including muscle spasms and constant pain/stiffness.    Pertinent History depression    Limitations Standing;Walking    How long can you sit comfortably? not limited- just pain    How long can you stand comfortably? unsure of time-painful    How long can you walk comfortably? painful after grocery shopping    Diagnostic tests none    Patient Stated Goals reduce pain and stiffness in the back    Currently in Pain? Yes    Pain Score 2     Pain Location Back   up to 5/10 max in the past week   Pain Orientation Right;Left;Upper;Lower    Pain Descriptors / Indicators Tightness    Pain Type Chronic pain    Pain Onset More than a month ago    Pain Frequency Constant    Aggravating Factors  standing, sitting    Pain Relieving Factors easier to ignore when busy, stretching, heat               [facility] PT Assessment - 08/16/20 0001      Assessment   Medical Diagnosis chronic Lt sided low back pain without sciatica ,upper back pain    Referring Provider (PT) [place], Betty, MD    Onset Date/Surgical Date 08/16/08    Next MD Visit none    Prior Therapy years ago      Precautions   Precautions None      Restrictions   Weight Bearing Restrictions No      Balance Screen   Has the patient fallen in the past 6 months No    Has the patient had a decrease in activity level because of a fear of falling?  No    Is the patient reluctant to leave their home because of a fear of falling?  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  Lt sided lumbar pain with Rt sidebending.  Bil UT pain with cervical sidebending.  Hyperflexibilty of the lumbar and cervical spine.       PROM   Overall PROM  Within functional limits for tasks performed      Strength   Overall Strength Within functional limits for tasks performed                      Objective measurements completed on examination: See above findings.               PT Education - 08/16/20 0938    Education Details Access Code: [email or web address]    Person(s) Educated Patient    Methods  Explanation;Demonstration;Handout    Comprehension Verbalized understanding;Returned demonstration            PT Short Term Goals - 08/16/20 0822      PT SHORT TERM GOAL #1   Title be independent in initial HEP    Time 4    Period Weeks    Status New    Target Date 09/13/20      PT SHORT TERM GOAL #2   Title report a 30% reduction in the frequency and intensity of back stiffness/spasms with standing at work    Time 4    Period Weeks    Status New    Target Date 09/13/20             PT Long Term Goals - 08/16/20 0842      PT LONG TERM GOAL #1   Title be independent in advanced HEP    Time 8    Period Weeks    Target Date 10/11/20      PT LONG TERM GOAL #2   Title reduce FOTO to < or = to 38% limitation    Time 8    Period Weeks    Status New    Target Date 10/11/20      PT LONG TERM GOAL #3   Title report < or = to 2/10 max LBP/thoracic pain with standing and walking    Time 8    Period Weeks    Status New    Target Date 10/11/20      PT LONG TERM GOAL #4   Title verbalize and demonstrate body mechanics modifications for lumbar protection with work tasks    Time 8    Period Weeks    Status New    Target Date 10/11/20      PT LONG TERM GOAL #5   Title report a 70% reduction in stiffness in the spine with daily tasks and work activities    Time 8    Period Weeks    Status New    Target Date 10/11/20                  Plan - 08/16/20 0901    Clinical Impression Statement Pt  presents to PT with Lt sided thoracic and lumbar pain of a chronic nature.  Pt reports that she has had pain and stiffness x 11 years.  Pt reports 2-5/10 pain in bil thoracic spine and Lt side of lumbar spine and describes this as stiffness/tightness.  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  Pain is reduced when distracted by activity, with heat and pain medication as needed.  Pt demonstrates  hyperflexibility in the lumbar and cervical spine.  Pt with muscular hypertrophy of the Lt lumbar paraspinals noted in forward flexion.  Pt with tension and trigger points in Lt lumbar paraspinals and bil upper traps.  Reduced spinal segmental mobility in the thoracic spine without pain.  Pt will benefit from skilled PT to address lumbar/thoracic pain, muscular tension and improve muscular stability to support hypermobility.    Examination-Activity Limitations Sit;Stand    Examination-Participation Restrictions Occupation;Laundry;Shop    Stability/Clinical Decision Making Stable/Uncomplicated    Clinical Decision Making Low    Rehab Potential Excellent    PT Frequency 1x / week    PT Duration 8 weeks    PT Treatment/Interventions ADLs/Self Care Home Management;Cryotherapy;Electrical Stimulation;Moist Heat;Neuromuscular re-education;Therapeutic exercise;Therapeutic activities;Functional mobility training;Patient/family education;Manual techniques;Dry needling;Taping    PT Next Visit Plan dry needling to upper traps, thoracic and lumbar multifidi, Lt lumbar paraspinals, body mechanics education, review HEP    PT Home Exercise Plan Access Code: [email or web address]    Consulted and Agree with Plan of Care Patient           Patient will benefit from skilled therapeutic intervention in order to improve the following deficits and impairments:  Decreased activity tolerance, Improper body mechanics, Pain, Increased muscle spasms  Visit Diagnosis: Pain in thoracic spine - Plan: PT plan of care cert/re-cert  Chronic left-sided low back pain without sciatica - Plan: PT plan of care cert/re-cert  Cramp and spasm - Plan: PT plan of care cert/re-cert     Problem List Patient Active Problem List   Diagnosis Date Noted  . GAD (generalized anxiety disorder) 07/07/2019  . ADD (attention deficit disorder) 07/07/2019  . Childhood asthma 03/31/2014  . Enlarged lymph node 03/31/2014  . Dysmenorrhea 03/31/2014  .  Depression 12/17/2011   Lorrene Reid, PT 08/16/20 9:15 AM  [facility] [address], [address] [place], [place], [address] Phone: [phone]   Fax:  [phone]  Name: Mary Bright MRN: [id number] Date of Birth: 1996/01/01

## 2020-08-16 NOTE — Patient Instructions (Signed)
Access Code: [facility] URL: [email or web address] Date: 08/16/2020 Prepared by: Tresa Endo  Exercises Sidelying Open Book Thoracic Rotation with Knee on Foam Roll - 2 x daily - 7 x weekly - 1 sets - 10 reps Cat-Camel - 2 x daily - 7 x weekly - 1 sets - 10 reps - 5 hold Child's Pose Stretch - 2 x daily - 7 x weekly - 1 sets - 3 reps - 20 hold Child's Pose with Sidebending - 2 x daily - 7 x weekly - 1 sets - 3 reps - 20 hold Seated Cervical Sidebending Stretch - 3 x daily - 7 x weekly - 1 sets - 3 reps - 20 hold

## 2020-08-23 ENCOUNTER — Other Ambulatory Visit: Payer: Self-pay

## 2020-08-23 ENCOUNTER — Ambulatory Visit: Payer: BC Managed Care – PPO

## 2020-08-23 DIAGNOSIS — M546 Pain in thoracic spine: Secondary | ICD-10-CM | POA: Diagnosis not present

## 2020-08-23 DIAGNOSIS — R252 Cramp and spasm: Secondary | ICD-10-CM

## 2020-08-23 DIAGNOSIS — G8929 Other chronic pain: Secondary | ICD-10-CM

## 2020-08-23 DIAGNOSIS — M545 Low back pain, unspecified: Secondary | ICD-10-CM | POA: Diagnosis not present

## 2020-08-23 NOTE — Therapy (Signed)
[facility] [address] [place], [place], [address] Phone: [phone]   Fax:  [phone]  Physical Therapy Treatment  Patient Details  Name: Mary Bright MRN: [id number] Date of Birth: June 13, 1996 Referring Provider (PT): [place], Betty, MD   Encounter Date: 08/23/2020   PT End of Session - 08/23/20 0850    Visit Number 2    Date for PT Re-Evaluation 10/11/20    Authorization Type BCBS    PT Start Time 0800    PT Stop Time 0840    PT Time Calculation (min) 40 min    Activity Tolerance Patient tolerated treatment well    Behavior During Therapy [facility] for tasks assessed/performed           Past Medical History:  Diagnosis Date  . Anxiety   . Asthma   . Depression    History of psych admission at age 60    Past Surgical History:  Procedure Laterality Date  . HAND SURGERY Right 2015    There were no vitals filed for this visit.   Subjective Assessment - 08/23/20 0759    Subjective I have done the exercises some.    Currently in Pain? Yes    Pain Score 4     Pain Location Back    Pain Orientation Left;Mid;Lower    Pain Descriptors / Indicators Tightness    Pain Type Chronic pain    Pain Frequency Constant    Aggravating Factors  standing, sitting    Pain Relieving Factors stretching, heat                             [facility] Adult PT Treatment/Exercise - 08/23/20 0001      Exercises   Exercises Neck;Lumbar      Lumbar Exercises: Sidelying   Other Sidelying Lumbar Exercises open book x 10 bil      Lumbar Exercises: Quadruped   Madcat/Old Horse 10 reps    Other Quadruped Lumbar Exercises Childs Pose and lateral childs pose x3 each      Manual Therapy   Manual Therapy Soft tissue mobilization;Myofascial release    Manual therapy comments bil upper traps and Lt thoracolumbar paraspinals      Neck Exercises: Stretches   Upper Trapezius Stretch 3 reps;20 seconds             Trigger Point Dry Needling - 08/23/20 0001    Consent Given? Yes    Education Handout Provided Yes    Muscles Treated Head and Neck Upper trapezius    Muscles Treated Back/Hip Lumbar multifidi    Dry Needling Comments thoracic multifidi    Upper Trapezius Response Twitch reponse elicited;Palpable increased muscle length    Lumbar multifidi Response Twitch response elicited;Palpable increased muscle length                PT Education - 08/23/20 0759    Education Details DN info    Person(s) Educated Patient    Methods Explanation;Demonstration;Handout    Comprehension Verbalized understanding;Returned demonstration            PT Short Term Goals - 08/16/20 0822      PT SHORT TERM GOAL #1   Title be independent in initial HEP    Time 4    Period Weeks    Status New    Target Date 09/13/20      PT SHORT TERM GOAL #2   Title  report a 30% reduction in the frequency and intensity of back stiffness/spasms with standing at work    Time 4    Period Weeks    Status New    Target Date 09/13/20             PT Long Term Goals - 08/16/20 0842      PT LONG TERM GOAL #1   Title be independent in advanced HEP    Time 8    Period Weeks    Target Date 10/11/20      PT LONG TERM GOAL #2   Title reduce FOTO to < or = to 38% limitation    Time 8    Period Weeks    Status New    Target Date 10/11/20      PT LONG TERM GOAL #3   Title report < or = to 2/10 max LBP/thoracic pain with standing and walking    Time 8    Period Weeks    Status New    Target Date 10/11/20      PT LONG TERM GOAL #4   Title verbalize and demonstrate body mechanics modifications for lumbar protection with work tasks    Time 8    Period Weeks    Status New    Target Date 10/11/20      PT LONG TERM GOAL #5   Title report a 70% reduction in stiffness in the spine with daily tasks and work activities    Time 8    Period Weeks    Status New    Target Date 10/11/20                  Plan - 08/23/20 0811    Clinical Impression Statement Pt with first time follow-up after evaluation.  Pt reports moderate compliance with HEP and was able to demo all aspects correctly.  Pt demonstrates excessive lumbar mobility with cat-camel stretch.  Pt with tension and trigger points in upper traps and thoracic/lumbar multifidi and demonstrated good twitch response in all areas.  Pt reported reduced pain and improved mobility after manual therapy and dry needling today.  Pt will continue to benefit from skilled PT to address muscle tension, postural and core strength and address pain as needed.    Rehab Potential Excellent    PT Frequency 1x / week    PT Duration 8 weeks    PT Treatment/Interventions ADLs/Self Care Home Management;Cryotherapy;Electrical Stimulation;Moist Heat;Neuromuscular re-education;Therapeutic exercise;Therapeutic activities;Functional mobility training;Patient/family education;Manual techniques;Dry needling;Taping    PT Next Visit Plan assess response to dry needling, body mechanics education, add strength to HEP (clam, theraband unattached)    PT Home Exercise Plan Access Code: [email or web address]    Consulted and Agree with Plan of Care Patient           Patient will benefit from skilled therapeutic intervention in order to improve the following deficits and impairments:  Decreased activity tolerance, Improper body mechanics, Pain, Increased muscle spasms  Visit Diagnosis: Pain in thoracic spine  Chronic left-sided low back pain without sciatica  Cramp and spasm     Problem List Patient Active Problem List   Diagnosis Date Noted  . GAD (generalized anxiety disorder) 07/07/2019  . ADD (attention deficit disorder) 07/07/2019  . Childhood asthma 03/31/2014  . Enlarged lymph node 03/31/2014  . Dysmenorrhea 03/31/2014  . Depression 12/17/2011     Lorrene Reid, PT 08/23/20 8:53 AM  [facility] [address] [place], [place], [address] Phone: [phone]   Fax:  [phone]  Name: SHARLETT LIENEMANN MRN: [id number] Date of Birth: Oct 25, 1995

## 2020-08-23 NOTE — Patient Instructions (Signed)

## 2020-09-15 ENCOUNTER — Ambulatory Visit: Payer: BC Managed Care – PPO | Attending: Family Medicine

## 2020-09-15 ENCOUNTER — Other Ambulatory Visit: Payer: Self-pay

## 2020-09-15 DIAGNOSIS — R252 Cramp and spasm: Secondary | ICD-10-CM | POA: Diagnosis not present

## 2020-09-15 DIAGNOSIS — M545 Low back pain, unspecified: Secondary | ICD-10-CM | POA: Diagnosis not present

## 2020-09-15 DIAGNOSIS — G8929 Other chronic pain: Secondary | ICD-10-CM | POA: Diagnosis not present

## 2020-09-15 DIAGNOSIS — M546 Pain in thoracic spine: Secondary | ICD-10-CM | POA: Insufficient documentation

## 2020-09-15 NOTE — Therapy (Signed)
[facility] [address] [place], [place], [address] Phone: [phone]   Fax:  [phone]  Physical Therapy Treatment  Patient Details  Name: Mary Bright MRN: [id number] Date of Birth: [birth year] Referring Provider (PT): [place], Betty, MD   Encounter Date: 09/15/2020   PT End of Session - 09/15/20 0845    Visit Number 3    Date for PT Re-Evaluation 10/11/20    Authorization Type BCBS    PT Start Time 0801    PT Stop Time 0844    PT Time Calculation (min) 43 min    Activity Tolerance Patient tolerated treatment well    Behavior During Therapy [facility] for tasks assessed/performed           Past Medical History:  Diagnosis Date  . Anxiety   . Asthma   . Depression    History of psych admission at age 40    Past Surgical History:  Procedure Laterality Date  . HAND SURGERY Right 2015    There were no vitals filed for this visit.   Subjective Assessment - 09/15/20 0806    Subjective Things are pretty sore today.    Currently in Pain? 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, standing and sitting    Pain Relieving Factors heat, stretching                             [facility] Adult PT Treatment/Exercise - 09/15/20 0001      Neck Exercises: Machines for Strengthening   UBE (Upper Arm Bike) Level 1x 4 minutes (2/2)-PT present to discuss progress      Lumbar Exercises: Stretches   Lower Trunk Rotation 3 reps;20 seconds      Lumbar Exercises: Supine   Ab Set 5 reps    Other Supine Lumbar Exercises horizontal abduction with bracing Red band 2x10      Lumbar Exercises: Sidelying   Clam Both;20 reps      Manual Therapy   Manual Therapy Soft  tissue mobilization;Myofascial release    Manual therapy comments bil upper traps and Lt thoracolumbar paraspinals            Trigger Point Dry Needling - 09/15/20 0001    Consent Given? Yes    Education Handout Provided Previously provided    Muscles Treated Head and Neck Upper trapezius    Muscles Treated Back/Hip Lumbar multifidi    Dry Needling Comments thoracic multifidi    Upper Trapezius Response Twitch reponse elicited;Palpable increased muscle length    Lumbar multifidi Response Twitch response elicited;Palpable increased muscle length                PT Education - 09/15/20 0818    Education Details Access Code: [email or web address]    Person(s) Educated Patient    Methods Explanation;Demonstration;Handout    Comprehension Verbalized understanding;Returned demonstration            PT Short Term Goals - 09/15/20 0807      PT SHORT TERM GOAL #1   Title be independent in initial HEP  Status Achieved      PT SHORT TERM GOAL #2   Title report a 30% reduction in the frequency and intensity of back stiffness/spasms with standing at work    Time 4    Period Weeks    Status On-going             PT Long Term Goals - 08/16/20 0842      PT LONG TERM GOAL #1   Title be independent in advanced HEP    Time 8    Period Weeks    Target Date 10/11/20      PT LONG TERM GOAL #2   Title reduce FOTO to < or = to 38% limitation    Time 8    Period Weeks    Status New    Target Date 10/11/20      PT LONG TERM GOAL #3   Title report < or = to 2/10 max LBP/thoracic pain with standing and walking    Time 8    Period Weeks    Status New    Target Date 10/11/20      PT LONG TERM GOAL #4   Title verbalize and demonstrate body mechanics modifications for lumbar protection with work tasks    Time 8    Period Weeks    Status New    Target Date 10/11/20      PT LONG TERM GOAL #5   Title report a 70% reduction in stiffness in the spine with daily tasks and work activities     Time 8    Period Weeks    Status New    Target Date 10/11/20                 Plan - 09/15/20 0093    Clinical Impression Statement Pt with lapse in treatment x 3 weeks.  Pt denies any change in symptoms since the start of care.  Pt reports significant tension and stress in the lumbar region and Lt upper traps.  PT educated pt in abdominal bracing with work tasks and transitional movements and added additional flexibility exercises.  Pt with tension and trigger points in the lumbar, thoracic and upper traps today and demonstrated improved tissue mobility after manual therapy and dry needling today.  Pt will continue to benefit from skilled PT to address chronic muscle tension, core and postural strength to assist with work tasks.    Rehab Potential Excellent    PT Frequency 1x / week    PT Duration 8 weeks    PT Treatment/Interventions ADLs/Self Care Home Management;Cryotherapy;Electrical Stimulation;Moist Heat;Neuromuscular re-education;Therapeutic exercise;Therapeutic activities;Functional mobility training;Patient/family education;Manual techniques;Dry needling;Taping    PT Next Visit Plan assess response to dry needling, body mechanics education, add strength to HEP (clam, theraband unattached)    PT Home Exercise Plan Access Code: [email or web address]    Consulted and Agree with Plan of Care Patient           Patient will benefit from skilled therapeutic intervention in order to improve the following deficits and impairments:  Decreased activity tolerance, Improper body mechanics, Pain, Increased muscle spasms  Visit Diagnosis: Pain in thoracic spine  Chronic left-sided low back pain without sciatica  Cramp and spasm     Problem List Patient Active Problem List   Diagnosis Date Noted  . GAD (generalized anxiety disorder) 07/07/2019  . ADD (attention deficit disorder) 07/07/2019  . Childhood asthma 03/31/2014  . Enlarged lymph node 03/31/2014  . Dysmenorrhea 03/31/2014  .  Depression 12/17/2011     Lorrene Reid, PT 09/15/20 8:47 AM  [facility] [address], [address] [place], [place], [address] Phone: [phone]   Fax:  [phone]  Name: SHERRYE PUGA MRN: [id number] Date of Birth: 06-May-1996

## 2020-09-15 NOTE — Patient Instructions (Signed)
Access Code: [facility] URL: [email or web address] Date: 09/15/2020 Prepared by: Tresa Endo  Exercises  Supine Lower Trunk Rotation - 2 x daily - 7 x weekly - 1 sets - 3 reps - 20 hold Standing Transverse Abdominis Contraction - 1 x daily - 7 x weekly - 3 sets - 10 reps Seated Transversus Abdominis Bracing - 1 x daily - 7 x weekly - 3 sets - 10 reps Clamshell - 2 x daily - 7 x weekly - 1 sets - 10 reps Supine Shoulder Horizontal Abduction with Resistance - 2 x daily - 7 x weekly - 2 sets - 10 reps

## 2020-09-19 ENCOUNTER — Ambulatory Visit: Payer: BC Managed Care – PPO | Admitting: Physical Therapy

## 2020-09-19 ENCOUNTER — Other Ambulatory Visit: Payer: Self-pay

## 2020-09-19 ENCOUNTER — Encounter: Payer: Self-pay | Admitting: Physical Therapy

## 2020-09-19 DIAGNOSIS — G8929 Other chronic pain: Secondary | ICD-10-CM

## 2020-09-19 DIAGNOSIS — R252 Cramp and spasm: Secondary | ICD-10-CM

## 2020-09-19 DIAGNOSIS — M546 Pain in thoracic spine: Secondary | ICD-10-CM | POA: Diagnosis not present

## 2020-09-19 DIAGNOSIS — M545 Low back pain, unspecified: Secondary | ICD-10-CM

## 2020-09-19 NOTE — Therapy (Signed)
[facility] [address] [place], [place], [address] Phone: [phone]   Fax:  [phone]  Physical Therapy Treatment  Patient Details  Name: Mary Bright MRN: [id number] Date of Birth: [birth year] Referring Provider (PT): [place], Betty, MD   Encounter Date: 09/19/2020   PT End of Session - 09/19/20 0845    Visit Number 4    Date for PT Re-Evaluation 10/11/20    Authorization Type BCBS    PT Start Time 0800    PT Stop Time 0840    PT Time Calculation (min) 40 min           Past Medical History:  Diagnosis Date  . Anxiety   . Asthma   . Depression    History of psych admission at age 74    Past Surgical History:  Procedure Laterality Date  . HAND SURGERY Right 2015    There were no vitals filed for this visit.   Subjective Assessment - 09/19/20 0804    Subjective Feeling sore today.    Patient Stated Goals reduce pain and stiffness in the back    Currently in Pain? Yes    Pain Score 5     Pain Location Back    Pain Orientation Left;Mid;Lower    Pain Descriptors / Indicators Sore    Pain Radiating Towards up to neck and shoulder    Pain Onset More than a month ago    Multiple Pain Sites No                             [facility] Adult PT Treatment/Exercise - 09/19/20 0001      Self-Care   Self-Care Other Self-Care Comments    Other Self-Care Comments  educated on tennis ball in supine for suboccipital release      Neck Exercises: Machines for Strengthening   UBE (Upper Arm Bike) Level 1x 4 minutes (2/2)-PT present to discuss progress      Lumbar Exercises: Supine   Other Supine Lumbar Exercises ER and bracing with red band x 10    Other Supine Lumbar Exercises horizontal abduction with bracing Red band 2x10      Lumbar Exercises: Sidelying   Clam Both;20 reps   cues for position to target intended muscle     Manual Therapy   Manual Therapy Myofascial release;Soft tissue  mobilization    Manual therapy comments fascial releases first followed by STM all done in supine    Soft tissue mobilization Lt mainly upper trap and cervical paraspinals, suboccipitials    Myofascial Release one hand anterior sternoclavicular joints, one posterior at T1/2 and fascial release shoulder girdle; suboccipitals and down throughout cervical paraspinals      Neck Exercises: Stretches   Other Neck Stretches cervical rotation and side bending in supine after STM                    PT Short Term Goals - 09/15/20 6294      PT SHORT TERM GOAL #1   Title be independent in initial HEP    Status Achieved      PT SHORT TERM GOAL #2   Title report a 30% reduction in the frequency and intensity of back stiffness/spasms with standing at work    Time 4    Period Weeks    Status On-going             PT  Long Term Goals - 08/16/20 0842      PT LONG TERM GOAL #1   Title be independent in advanced HEP    Time 8    Period Weeks    Target Date 10/11/20      PT LONG TERM GOAL #2   Title reduce FOTO to < or = to 38% limitation    Time 8    Period Weeks    Status New    Target Date 10/11/20      PT LONG TERM GOAL #3   Title report < or = to 2/10 max LBP/thoracic pain with standing and walking    Time 8    Period Weeks    Status New    Target Date 10/11/20      PT LONG TERM GOAL #4   Title verbalize and demonstrate body mechanics modifications for lumbar protection with work tasks    Time 8    Period Weeks    Status New    Target Date 10/11/20      PT LONG TERM GOAL #5   Title report a 70% reduction in stiffness in the spine with daily tasks and work activities    Time 8    Period Weeks    Status New    Target Date 10/11/20                 Plan - 09/19/20 0845    Clinical Impression Statement Pt responded well to beginning with fascial release and felt less soreness after treatment.  Pt has had mixed outcome from dry needling at this point but may  benefit from when she deosn't have to go to work afterwards.  Pt needed cues during exercises still has a hard time keeping trunk stability due to core weakness and she will benefit from strengthening for improved fucntional activities.    PT Treatment/Interventions ADLs/Self Care Home Management;Cryotherapy;Electrical Stimulation;Moist Heat;Neuromuscular re-education;Therapeutic exercise;Therapeutic activities;Functional mobility training;Patient/family education;Manual techniques;Dry needling;Taping    PT Next Visit Plan assess response to STM and using tennis balls for suboccipital release; continue core strength progression; STM and DN as needed    PT Home Exercise Plan Access Code: [email or web address]    Consulted and Agree with Plan of Care Patient           Patient will benefit from skilled therapeutic intervention in order to improve the following deficits and impairments:  Decreased activity tolerance, Improper body mechanics, Pain, Increased muscle spasms  Visit Diagnosis: Pain in thoracic spine  Chronic left-sided low back pain without sciatica  Cramp and spasm     Problem List Patient Active Problem List   Diagnosis Date Noted  . GAD (generalized anxiety disorder) 07/07/2019  . ADD (attention deficit disorder) 07/07/2019  . Childhood asthma 03/31/2014  . Enlarged lymph node 03/31/2014  . Dysmenorrhea 03/31/2014  . Depression 12/17/2011    Brayton Caves Caileigh Canche,PT 09/19/2020, 8:48 AM  [facility] [address] [place], [place], [address] Phone: [phone]   Fax:  [phone]  Name: Mary Bright MRN: [id number] Date of Birth: 08/10/1996

## 2020-09-28 ENCOUNTER — Encounter: Payer: Self-pay | Admitting: Physical Therapy

## 2020-09-28 ENCOUNTER — Other Ambulatory Visit: Payer: Self-pay

## 2020-09-28 ENCOUNTER — Ambulatory Visit: Payer: BC Managed Care – PPO | Admitting: Physical Therapy

## 2020-09-28 DIAGNOSIS — M546 Pain in thoracic spine: Secondary | ICD-10-CM | POA: Diagnosis not present

## 2020-09-28 DIAGNOSIS — R252 Cramp and spasm: Secondary | ICD-10-CM | POA: Diagnosis not present

## 2020-09-28 DIAGNOSIS — M545 Low back pain, unspecified: Secondary | ICD-10-CM

## 2020-09-28 DIAGNOSIS — G8929 Other chronic pain: Secondary | ICD-10-CM | POA: Diagnosis not present

## 2020-09-28 NOTE — Therapy (Signed)
[facility] [address] [place], [place], [address] Phone: [phone]   Fax:  [phone]  Physical Therapy Treatment  Patient Details  Name: AMEILA WELDON MRN: [id number] Date of Birth: 07/21/1996 Referring Provider (PT): [place], Betty, MD   Encounter Date: 09/28/2020   PT End of Session - 09/28/20 1538    Visit Number 5    Date for PT Re-Evaluation 10/11/20    Authorization Type BCBS    PT Start Time 1535    PT Stop Time 1613    PT Time Calculation (min) 38 min    Activity Tolerance Patient tolerated treatment well    Behavior During Therapy [facility] for tasks assessed/performed           Past Medical History:  Diagnosis Date  . Anxiety   . Asthma   . Depression    History of psych admission at age 24    Past Surgical History:  Procedure Laterality Date  . HAND SURGERY Right 2015    There were no vitals filed for this visit.   Subjective Assessment - 09/28/20 1538    Subjective Pt states she felt better the day of her previous treatment and lasted all day.  States she did not feel better after that.    Currently in Pain? Yes    Pain Score 6     Pain Location Back    Pain Orientation Right;Left    Pain Descriptors / Indicators Sore;Aching    Pain Type Chronic pain    Pain Radiating Towards neck/shoulder and mid/low back    Pain Onset More than a month ago    Pain Frequency Intermittent                             [facility] Adult PT Treatment/Exercise - 09/28/20 0001      Neck Exercises: Standing   Other Standing Exercises pec stretch in doorway - 2x 30 sec      Neck Exercises: Seated   Other Seated Exercise upper trap stretch 3 x bil      Manual Therapy   Soft tissue mobilization Lt pecs and bil cervical paraspinals and suboccipitals    Myofascial Release diaphragm and suboccipital fascial release'with craniosacral techniques                    PT Short Term  Goals - 09/15/20 0807      PT SHORT TERM GOAL #1   Title be independent in initial HEP    Status Achieved      PT SHORT TERM GOAL #2   Title report a 30% reduction in the frequency and intensity of back stiffness/spasms with standing at work    Time 4    Period Weeks    Status On-going             PT Long Term Goals - 08/16/20 0842      PT LONG TERM GOAL #1   Title be independent in advanced HEP    Time 8    Period Weeks    Target Date 10/11/20      PT LONG TERM GOAL #2   Title reduce FOTO to < or = to 38% limitation    Time 8    Period Weeks    Status New    Target Date 10/11/20      PT LONG TERM GOAL #3   Title report <  or = to 2/10 max LBP/thoracic pain with standing and walking    Time 8    Period Weeks    Status New    Target Date 10/11/20      PT LONG TERM GOAL #4   Title verbalize and demonstrate body mechanics modifications for lumbar protection with work tasks    Time 8    Period Weeks    Status New    Target Date 10/11/20      PT LONG TERM GOAL #5   Title report a 70% reduction in stiffness in the spine with daily tasks and work activities    Time 8    Period Weeks    Status New    Target Date 10/11/20                 Plan - 09/28/20 1642    Clinical Impression Statement Pt responds better to gentle myofascial release.  Pt had increased guarding when attempting to do deep release to trigger points.  Pt had excellent release to suboccipitals and release into fascia just superior to that in occipital cranial bone.  Used craniosacral fascial techniques.  Pt states she could feel improvement of muscle tension in shoulder and neck. Pt had observable increased ribcage excursion during inspiration after release to diaphragm.  Pt given stretches to maintain improved soft tissue length and added to HEP.  Continue to work on [employer] and posture techniques recommended.           Patient will benefit from skilled therapeutic intervention in order  to improve the following deficits and impairments:     Visit Diagnosis: Pain in thoracic spine  Chronic left-sided low back pain without sciatica  Cramp and spasm     Problem List Patient Active Problem List   Diagnosis Date Noted  . GAD (generalized anxiety disorder) 07/07/2019  . ADD (attention deficit disorder) 07/07/2019  . Childhood asthma 03/31/2014  . Enlarged lymph node 03/31/2014  . Dysmenorrhea 03/31/2014  . Depression 12/17/2011    Junious Silk, PT 09/28/2020, 5:08 PM  [facility] [address] [place], [place], [address] Phone: [phone]   Fax:  [phone]  Name: DANYLLE OUK MRN: [id number] Date of Birth: 11/05/1995

## 2020-10-05 ENCOUNTER — Other Ambulatory Visit: Payer: Self-pay

## 2020-10-05 ENCOUNTER — Ambulatory Visit: Payer: BC Managed Care – PPO

## 2020-10-05 DIAGNOSIS — R252 Cramp and spasm: Secondary | ICD-10-CM | POA: Diagnosis not present

## 2020-10-05 DIAGNOSIS — M546 Pain in thoracic spine: Secondary | ICD-10-CM | POA: Diagnosis not present

## 2020-10-05 DIAGNOSIS — M545 Low back pain, unspecified: Secondary | ICD-10-CM

## 2020-10-05 DIAGNOSIS — G8929 Other chronic pain: Secondary | ICD-10-CM | POA: Diagnosis not present

## 2020-10-05 NOTE — Therapy (Signed)
[facility] [address] [place], [place], [address] Phone: [phone]   Fax:  [phone]  Physical Therapy Treatment  Patient Details  Name: Mary Bright MRN: [id number] Date of Birth: 17-Jul-1996 Referring Provider (PT): [place], Betty, MD   Encounter Date: 10/05/2020   PT End of Session - 10/05/20 1606    Visit Number 6    Date for PT Re-Evaluation 10/11/20    Authorization Type BCBS    PT Start Time 1532    PT Stop Time 1619    PT Time Calculation (min) 47 min    Activity Tolerance Patient tolerated treatment well    Behavior During Therapy [facility] for tasks assessed/performed           Past Medical History:  Diagnosis Date  . Anxiety   . Asthma   . Depression    History of psych admission at age 13    Past Surgical History:  Procedure Laterality Date  . HAND SURGERY Right 2015    There were no vitals filed for this visit.   Subjective Assessment - 10/05/20 1533    Subjective I don't feel like things have changed too much.  I feel 10% better overall.  I'm off work this week and next.    Currently in Pain? Yes    Pain Score 4     Pain Location Back    Pain Orientation Left    Pain Descriptors / Indicators Sore;Tightness    Pain Type Chronic pain    Pain Onset More than a month ago    Pain Frequency Intermittent    Aggravating Factors  not moving enough, standing in one spot for too long, lifting    Pain Relieving Factors heat, stretching                             [facility] Adult PT Treatment/Exercise - 10/05/20 0001      Neck Exercises: Machines for Strengthening   UBE (Upper Arm Bike) Level 1x 4 minutes (2/2)-PT present to discuss progress      Neck Exercises: Theraband   Horizontal ABduction 10 reps    Horizontal ABduction Limitations yellow theraband      Lumbar Exercises: Stretches   Lower Trunk Rotation 3 reps;20 seconds      Lumbar Exercises: Sidelying   Other  Sidelying Lumbar Exercises open book x10      Lumbar Exercises: Quadruped   Madcat/Old Horse 10 reps      Modalities   Modalities Electrical Stimulation;Moist Heat      Moist Heat Therapy   Number Minutes Moist Heat 15 Minutes    Moist Heat Location Lumbar Spine      Electrical Stimulation   Electrical Stimulation Location lumbar and thoracic spine    Electrical Stimulation Action IFC    Electrical Stimulation Parameters 15 minutes    Electrical Stimulation Goals Pain      Manual Therapy   Manual Therapy Joint mobilization    Joint Mobilization PA mobs thoracic and lumbar spine                  PT Education - 10/05/20 1606    Education Details Home TENs info, consistency of stretching, reduce reps with horizontal abduction    Person(s) Educated Patient    Methods Explanation;Handout    Comprehension Verbalized understanding            PT Short Term Goals -  09/15/20 0807      PT SHORT TERM GOAL #1   Title be independent in initial HEP    Status Achieved      PT SHORT TERM GOAL #2   Title report a 30% reduction in the frequency and intensity of back stiffness/spasms with standing at work    Time 4    Period Weeks    Status On-going             PT Long Term Goals - 10/05/20 1536      PT LONG TERM GOAL #1   Title be independent in advanced HEP    Time 8    Period Weeks    Status On-going      PT LONG TERM GOAL #3   Title report < or = to 2/10 max LBP/thoracic pain with standing and walking    Baseline 6/10    Time 8    Period Weeks    Status On-going      PT LONG TERM GOAL #4   Title verbalize and demonstrate body mechanics modifications for lumbar protection with work tasks    Status Achieved      PT LONG TERM GOAL #5   Title report a 70% reduction in stiffness in the spine with daily tasks and work activities    Baseline 10% overall improvement    Status On-going                 Plan - 10/05/20 1614    Clinical Impression  Statement Pt reports that she feels good for a day after she comes here and overall not a significant change in pain since the start of care.  Pt reports up to 6/10 Lt>Rt lumbar pain with standing, lifting and daily tasks.  Pt reports 10% overall reduction in pain.  Pt is performing 1 stretch 1x daily.  PT educated pt on importance of increased consistency with flexibility exercises to improve flexibility and reduce feeling of stiffness.  PT issued information for home TENs unit for pain management and did electrical stimulation trial in the clinic to determine impact on pain.  Pt will attend 1 more session for reassessment with probable D/C due to limited progress toward goals.    PT Frequency 1x / week    PT Duration 8 weeks    PT Treatment/Interventions ADLs/Self Care Home Management;Cryotherapy;Electrical Stimulation;Moist Heat;Neuromuscular re-education;Therapeutic exercise;Therapeutic activities;Functional mobility training;Patient/family education;Manual techniques;Dry needling;Taping    PT Next Visit Plan reassessment next session.  Probable D/C to HEP due to lack of progress.    PT Home Exercise Plan Access Code: [email or web address]    Consulted and Agree with Plan of Care Patient           Patient will benefit from skilled therapeutic intervention in order to improve the following deficits and impairments:  Decreased activity tolerance,Improper body mechanics,Pain,Increased muscle spasms  Visit Diagnosis: Pain in thoracic spine  Chronic left-sided low back pain without sciatica  Cramp and spasm     Problem List Patient Active Problem List   Diagnosis Date Noted  . GAD (generalized anxiety disorder) 07/07/2019  . ADD (attention deficit disorder) 07/07/2019  . Childhood asthma 03/31/2014  . Enlarged lymph node 03/31/2014  . Dysmenorrhea 03/31/2014  . Depression 12/17/2011     Lorrene Reid, PT 10/05/20 4:15 PM  [facility] [address] [place], [place], [address] Phone: [phone]   Fax:  [phone]  Name: Mary Bright MRN: [id number]  Date of Birth: July 17, 1996

## 2020-10-10 ENCOUNTER — Ambulatory Visit: Payer: BC Managed Care – PPO

## 2020-10-10 ENCOUNTER — Other Ambulatory Visit: Payer: Self-pay

## 2020-10-10 DIAGNOSIS — M546 Pain in thoracic spine: Secondary | ICD-10-CM

## 2020-10-10 DIAGNOSIS — M545 Low back pain, unspecified: Secondary | ICD-10-CM | POA: Diagnosis not present

## 2020-10-10 DIAGNOSIS — G8929 Other chronic pain: Secondary | ICD-10-CM

## 2020-10-10 DIAGNOSIS — R252 Cramp and spasm: Secondary | ICD-10-CM | POA: Diagnosis not present

## 2020-10-10 NOTE — Therapy (Signed)
[facility] [address] [place], [place], [address] Phone: [phone]   Fax:  [phone]  Physical Therapy Treatment  Patient Details  Name: Mary Bright MRN: [id number] Date of Birth: 23-Apr-1996 Referring Provider (PT): Martin[place], Betty, MD   Encounter Date: 10/10/2020   PT End of Session - 10/10/20 1520    Visit Number 7    PT Start Time 1450    PT Stop Time 1740    PT Time Calculation (min) 43 min    Activity Tolerance Patient tolerated treatment well    Behavior During Therapy [facility] for tasks assessed/performed           Past Medical History:  Diagnosis Date  . Anxiety   . Asthma   . Depression    History of psych admission at age 47    Past Surgical History:  Procedure Laterality Date  . HAND SURGERY Right 2015    There were no vitals filed for this visit.   Subjective Assessment - 10/10/20 1454    Subjective I am feeling good today.  I liked the TENs unit in here and I ordered one. I feel about 10% better overall.    Currently in Pain? Yes    Pain Score 3    6/10 max   Pain Location Back    Pain Orientation Left;Right;Mid    Pain Descriptors / Indicators Sore;Tightness    Pain Type Chronic pain    Pain Onset More than a month ago    Pain Frequency Intermittent    Aggravating Factors  not moving enough, standing in one spot for too long    Pain Relieving Factors heat, streching              [facility] PT Assessment - 10/10/20 0001      Assessment   Medical Diagnosis chronic Lt sided low back pain without sciatica ,upper back pain    Referring Provider (PT) Martinique, Betty, MD    Onset Date/Surgical Date 08/16/08      Prior Function   Level of Independence Independent    Vocation Full time employment    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      Cognition   Overall Cognitive Status Within Functional Limits for tasks assessed      Observation/Other Assessments    Focus on Therapeutic Outcomes (FOTO)  44% limitation                         [facility] Adult PT Treatment/Exercise - 10/10/20 0001      Neck Exercises: Machines for Strengthening   UBE (Upper Arm Bike) Level 1x 4 minutes (2/2)-PT present to discuss progress      Neck Exercises: Theraband   Horizontal ABduction 20 reps    Horizontal ABduction Limitations yellow theraband      Neck Exercises: Standing   Other Standing Exercises pec stretch in doorway - 2x 30 sec      Lumbar Exercises: Stretches   Lower Trunk Rotation 3 reps;20 seconds      Lumbar Exercises: Sidelying   Other Sidelying Lumbar Exercises open book x10      Lumbar Exercises: Quadruped   Madcat/Old Horse 10 reps      Modalities   Modalities Electrical Stimulation;Moist Heat      Moist Heat Therapy   Number Minutes Moist Heat 15 Minutes    Moist Heat Location Lumbar Spine      Electrical  Stimulation   Electrical Stimulation Location Lt thoracic and lumbar spine    Electrical Stimulation Action IF    Electrical Stimulation Parameters 15 minutes    Electrical Stimulation Goals Pain                    PT Short Term Goals - 09/15/20 0807      PT SHORT TERM GOAL #1   Title be independent in initial HEP    Status Achieved      PT SHORT TERM GOAL #2   Title report a 30% reduction in the frequency and intensity of back stiffness/spasms with standing at work    Time 4    Period Weeks    Status On-going             PT Long Term Goals - 10/10/20 1501      PT LONG TERM GOAL #1   Title be independent in advanced HEP    Status Achieved      PT LONG TERM GOAL #2   Title reduce FOTO to < or = to 38% limitation    Baseline 44% limitation    Status Partially Met      PT LONG TERM GOAL #3   Title report < or = to 2/10 max LBP/thoracic pain with standing and walking    Baseline 6/10    Status Partially Met      PT LONG TERM GOAL #4   Title verbalize and demonstrate body mechanics  modifications for lumbar protection with work tasks    Status Achieved      PT LONG TERM GOAL #5   Title report a 70% reduction in stiffness in the spine with daily tasks and work activities    Baseline 10% overall improvement    Status Partially Met                 Plan - 10/10/20 1512    Clinical Impression Statement Pt reports minimal change in symptoms since the start of care and reports 10% improvement overall.  Pt has been stretching intermittently and has not been consistent with it.  Pt had good relief of symptoms with electrical stimulation last session and has ordered one for home.  Pt will have reduction of symptoms after manual therapy in the clinic for a short period after session and reports that pain returns to prior level.  PT reviewed all HEP with pt and instructed in the importance of compliance with this for further gains.  Pt will D/C to HEP today and follow-up with MD as needed.    PT Next Visit Plan D/C PT to HEP    PT Home Exercise Plan Access Code: [facility]    Recommended Other Services initial cert is signed    Consulted and Agree with Plan of Care Patient           Patient will benefit from skilled therapeutic intervention in order to improve the following deficits and impairments:     Visit Diagnosis: Pain in thoracic spine  Chronic left-sided low back pain without sciatica     Problem List Patient Active Problem List   Diagnosis Date Noted  . GAD (generalized anxiety disorder) 07/07/2019  . ADD (attention deficit disorder) 07/07/2019  . Childhood asthma 03/31/2014  . Enlarged lymph node 03/31/2014  . Dysmenorrhea 03/31/2014  . Depression 12/17/2011  PHYSICAL THERAPY DISCHARGE SUMMARY  Visits from Start of Care: 7  Current functional level related to goals / functional outcomes: See  above for current status.  Pt will follow-up with MD.    Remaining deficits: Thoracic and lumbar pain with activity.     Education / Equipment: HEP,  Economist education Plan: Patient agrees to discharge.  Patient goals were partially met. Patient is being discharged due to lack of progress.  ?????        Sigurd Sos, PT 10/10/20 3:22 PM  [facility] [address] [place], [place], [address] Phone: [phone]   Fax:  [phone]  Name: CHANTEL TETI MRN: [id number] Date of Birth: 09-29-1996

## 2020-12-16 ENCOUNTER — Other Ambulatory Visit: Payer: Self-pay

## 2020-12-19 ENCOUNTER — Other Ambulatory Visit: Payer: Self-pay

## 2020-12-19 ENCOUNTER — Ambulatory Visit (INDEPENDENT_AMBULATORY_CARE_PROVIDER_SITE_OTHER): Payer: Self-pay

## 2020-12-19 ENCOUNTER — Ambulatory Visit (INDEPENDENT_AMBULATORY_CARE_PROVIDER_SITE_OTHER): Payer: Self-pay | Admitting: Family Medicine

## 2020-12-19 ENCOUNTER — Encounter: Payer: Self-pay | Admitting: Family Medicine

## 2020-12-19 VITALS — BP 120/70 | HR 74 | Resp 12 | Ht 68.25 in | Wt 144.2 lb

## 2020-12-19 DIAGNOSIS — R200 Anesthesia of skin: Secondary | ICD-10-CM

## 2020-12-19 DIAGNOSIS — M549 Dorsalgia, unspecified: Secondary | ICD-10-CM

## 2020-12-19 DIAGNOSIS — G8929 Other chronic pain: Secondary | ICD-10-CM

## 2020-12-19 DIAGNOSIS — M545 Low back pain, unspecified: Secondary | ICD-10-CM

## 2020-12-19 MED ORDER — DULOXETINE HCL 30 MG PO CPEP: 30.0000 mg | ORAL_CAPSULE | Freq: Every day | ORAL | 1 refills | Status: DC

## 2020-12-19 NOTE — Patient Instructions (Addendum)
A few things to remember from today's visit:   Chronic bilateral low back pain without sciatica - Plan: DG Lumbar Spine Complete, Sedimentation rate, C-reactive protein, CBC, Ambulatory referral to Sports Medicine  Upper back pain - Plan: Ambulatory referral to Sports Medicine  Numbness of toes - Plan: CBC, Vitamin B12, Basic metabolic panel, TSH  If you need refills please call your pharmacy. Do not use My Chart to request refills or for acute issues that need immediate attention.   Cymbalta 30 mg daily may help with back pain. Sport medicine appt is being arranged.  Please be sure medication list is accurate. If a new problem present, please set up appointment sooner than planned today.

## 2020-12-19 NOTE — Progress Notes (Signed)
Chief Complaint  Patient presents with  . Back Pain   HPI: Mary Bright is a 25 y.o. female, who is here today with above complaint. This is a chronic problem , getting worse.  Upper and lower back pain, L>R. Achy and sometimes sharp pain.  Pain is constant now. She completed PT but did not help.  Now pain is 5/10 but can be worse. It is worse in the morning. + Stiffness. Wrist pain, R>L. No edema or erythema. Right handed.  She denies any recent injury or unusual level of activity. Numbness bilateral great toes for sometimes, exacerbated by prolonged standing.  Back pain is not radiated. Negative for associated LE numbness, tingling, urinary incontinence or retention, stool incontinence, or saddle anesthesia.  Negative for fever,abnormal wt loss,night sweats, rash or edema on affected areas.  OTC medications: Ibuprofen or advil. She is not taking Zanaflex, did not help.  LMP 3-4 week ago.  Lab Results  Component Value Date   VITAMINB12 686 12/15/2011   Lab Results  Component Value Date   TSH 3.521 12/15/2011   Review of Systems  Constitutional: Negative for activity change, appetite change and fatigue.  HENT: Negative for mouth sores, nosebleeds and sore throat.   Respiratory: Negative for cough, shortness of breath and wheezing.   Cardiovascular: Negative for leg swelling.  Gastrointestinal: Negative for abdominal pain, nausea and vomiting.       Negative for changes in bowel habits.  Genitourinary: Negative for decreased urine volume, dysuria, hematuria, vaginal bleeding and vaginal discharge.  Musculoskeletal: Negative for gait problem and joint swelling.  Hematological: Negative for adenopathy.  Rest see pertinent positives and negatives per HPI.  Current Outpatient Medications on File Prior to Visit  Medication Sig Dispense Refill  . etonogestrel-ethinyl estradiol (NUVARING) 0.12-0.015 MG/24HR vaginal ring Insert vaginally and leave in place for 3  consecutive weeks, then remove for 1 week. 4 each 3   No current facility-administered medications on file prior to visit.   Past Medical History:  Diagnosis Date  . Anxiety   . Asthma   . Depression    History of psych admission at age 64   No Known Allergies  Social History   Socioeconomic History  . Marital status: Single    Spouse name: Not on file  . Number of children: Not on file  . Years of education: Not on file  . Highest education level: Not on file  Occupational History  . Not on file  Tobacco Use  . Smoking status: Never Smoker  . Smokeless tobacco: Never Used  Vaping Use  . Vaping Use: Never used  Substance and Sexual Activity  . Alcohol use: Yes    Comment: socially  . Drug use: No  . Sexual activity: Not on file  Other Topics Concern  . Not on file  Social History Narrative   Student at [employer]   Good grades   Extracurricular activities - drama / theatre club   Social Determinants of Health   Financial Resource Strain: Not on file  Food Insecurity: Not on file  Transportation Needs: Not on file  Physical Activity: Not on file  Stress: Not on file  Social Connections: Not on file    Vitals:   12/19/20 1605  BP: 120/70  Pulse: 74  Resp: 12  SpO2: 99%   Body mass index is 21.77 kg/m.  Physical Exam Vitals and nursing note reviewed.  Constitutional:      General: She is  not in acute distress.    Appearance: She is well-developed.  HENT:     Head: Normocephalic and atraumatic.     Mouth/Throat:     Mouth: Oropharynx is clear and moist and mucous membranes are normal.  Eyes:     Conjunctiva/sclera: Conjunctivae normal.  Cardiovascular:     Rate and Rhythm: Normal rate and regular rhythm.     Pulses:          Dorsalis pedis pulses are 2+ on the right side and 2+ on the left side.     Heart sounds: No murmur heard.   Pulmonary:     Effort: Pulmonary effort is normal. No respiratory distress.     Breath sounds: Normal  breath sounds.  Abdominal:     Palpations: Abdomen is soft. There is no hepatomegaly or mass.     Tenderness: There is no abdominal tenderness.  Musculoskeletal:        General: No edema.     Cervical back: Tenderness present. No bony tenderness.     Thoracic back: Tenderness present. No bony tenderness.     Lumbar back: Tenderness present. No bony tenderness.       Back:     Comments: Negative for tender trigger points.  Lymphadenopathy:     Cervical: No cervical adenopathy.  Skin:    General: Skin is warm.     Findings: No erythema or rash.  Neurological:     General: No focal deficit present.     Mental Status: She is alert and oriented to person, place, and time.     Gait: Gait normal.     Deep Tendon Reflexes: Strength normal.     Reflex Scores:      Patellar reflexes are 2+ on the right side and 2+ on the left side.    Comments: Normal feet mofilament.  Psychiatric:        Mood and Affect: Mood and affect normal.     Comments: Well groomed, good eye contact.   ASSESSMENT AND PLAN:  Mary Bright was seen today for back pain.  Diagnoses and all orders for this visit: Orders Placed This Encounter  Procedures  . DG Lumbar Spine Complete  . Sedimentation rate  . C-reactive protein  . CBC  . Vitamin B12  . Basic metabolic panel  . TSH  . Ambulatory referral to Sports Medicine   Lab Results  Component Value Date   TSH 4.52 (H) 12/19/2020   Lab Results  Component Value Date   ESRSEDRATE 6 12/19/2020   Lab Results  Component Value Date   CRP <1.0 12/19/2020   Lab Results  Component Value Date   CREATININE 0.57 12/19/2020   BUN 11 12/19/2020   NA 139 12/19/2020   K 3.7 12/19/2020   CL 105 12/19/2020   CO2 23 12/19/2020   Lab Results  Component Value Date   VITAMINB12 279 12/19/2020   Lab Results  Component Value Date   WBC 8.6 12/19/2020   HGB 13.2 12/19/2020   HCT 38.9 12/19/2020   MCV 87.4 12/19/2020   PLT 258.0 12/19/2020   Chronic bilateral low  back pain without sciatica Getting worse, PT did not help. Sport medicine referral placed. Further recommendations will be given according to imaging and lab results.  Upper back pain Chronic and not better with PT. We discussed a few options in regard to pharmacologic treatment. She agrees with trying Duloxetine 30 mg daily, some side effects discussed.  -  DULoxetine (CYMBALTA) 30 MG capsule; Take 1 capsule (30 mg total) by mouth daily.  Numbness of toes Chronic. Hx and examination today do not suggest a serious process. Appropriate foot care discussed. Monitor for new symptoms. Further recommendations according to lab results.  Return in about 3 months (around 03/21/2021).   Jasline Buskirk G. [place], MD  [facility]. [facility].  A few things to remember from today's visit:   Chronic bilateral low back pain without sciatica - Plan: DG Lumbar Spine Complete, Sedimentation rate, C-reactive protein, CBC, Ambulatory referral to Sports Medicine  Upper back pain - Plan: Ambulatory referral to Sports Medicine  Numbness of toes - Plan: CBC, Vitamin B12, Basic metabolic panel, TSH  If you need refills please call your pharmacy. Do not use My Chart to request refills or for acute issues that need immediate attention.   Cymbalta 30 mg daily may help with back pain. Sport medicine appt is being arranged.  Please be sure medication list is accurate. If a new problem present, please set up appointment sooner than planned today.

## 2020-12-20 LAB — BASIC METABOLIC PANEL
BUN: 11 mg/dL (ref 6–23)
CO2: 23 mEq/L (ref 19–32)
Calcium: 9.6 mg/dL (ref 8.4–10.5)
Chloride: 105 mEq/L (ref 96–112)
Creatinine, Ser: 0.57 mg/dL (ref 0.40–1.20)
GFR: 128.24 mL/min (ref >60.00)
Glucose, Bld: 77 mg/dL (ref 70–99)
Potassium: 3.7 mEq/L (ref 3.5–5.1)
Sodium: 139 mEq/L (ref 135–145)

## 2020-12-20 LAB — VITAMIN B12: Vitamin B-12: 279 pg/mL (ref 211–911)

## 2020-12-20 LAB — TSH: TSH: 4.52 u[IU]/mL — ABNORMAL HIGH (ref 0.35–4.50)

## 2020-12-20 LAB — C-REACTIVE PROTEIN: CRP: <1 mg/dL (ref 0.5–20.0)

## 2020-12-20 LAB — SEDIMENTATION RATE: Sed Rate: 6 mm/hr (ref 0–20)

## 2020-12-20 LAB — CBC
HCT: 38.9 % (ref 36.0–46.0)
Hemoglobin: 13.2 g/dL (ref 12.0–15.0)
MCHC: 34 g/dL (ref 30.0–36.0)
MCV: 87.4 fl (ref 78.0–100.0)
Platelets: 258 10*3/uL (ref 150.0–400.0)
RBC: 4.45 Mil/uL (ref 3.87–5.11)
RDW: 13.4 % (ref 11.5–15.5)
WBC: 8.6 10*3/uL (ref 4.0–10.5)

## 2020-12-27 NOTE — Progress Notes (Signed)
Rito Ehrlich, am serving as a Education administrator for Dr. Lynne Leader.  Subjective:    I'm seeing this patient as a consultation for Dr. Betty Martinique. Note will be routed back to referring provider/PCP.  CC: Chronic bilat back pain  HPI: Pt is a 25 y/o female c/o back pain that is chronic, but worsening lately, L>R. Pt locates pain to superior angle and medial border of R scapula and R side of low back. No MOI states that she has had back problems starting from 25 years old with spasms in her neck. 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    Radiates: up her back to her shoulder LE Numbness/tingling: in feet and toes. Washing dishes is when she notices it LE Weakness: no Aggravates: worse in the mornings, prolonged standing Treatments tried: PT- did not help, IBU, Duloxetine  Dx imaging: 12/19/20 L-spine XR   She has had physical therapy divided across her lower back and her upper back.  She notes the physical therapy was much more focused on her lower back that her trapezius/upper back pain.  Past medical history, Surgical history, Family history, Social history, Allergies, and medications have been entered into the medical record, reviewed.   Review of Systems: No new headache, visual changes, nausea, vomiting, diarrhea, constipation, dizziness, abdominal pain, skin rash, fevers, chills, night sweats, weight loss, swollen lymph nodes, body aches, joint swelling, muscle aches, chest pain, shortness of breath, mood changes, visual or auditory hallucinations.   Objective:    Vitals:   12/28/20 1529  BP: 112/78  Pulse: 61  SpO2: 98%   General: Well Developed, well nourished, and in no acute distress.  Neuro/Psych: Alert and oriented x3, extra-ocular muscles intact, able to move all 4 extremities, sensation grossly intact. Skin: Warm and dry, no rashes noted.  Respiratory: Not using accessory muscles, speaking in full sentences, trachea midline.   Cardiovascular: Pulses palpable, no extremity edema. Abdomen: Does not appear distended. MSK: C-spine normal-appearing Nontender midline. Normal cervical motion. Tender palpation bilateral trapezius. Upper extremity reflexes and sensation and strength are equal and normal throughout.  T-spine normal-appearing nontender midline.  Normal thoracic motion  L-spine normal-appearing nontender midline.  Normal lumbar motion. Lower extremity strength reflexes and sensation are equal and normal throughout   Lab and Radiology Results  DG Lumbar Spine Complete  Result Date: 12/20/2020 CLINICAL DATA:  Worsening low back pain EXAM: LUMBAR SPINE - COMPLETE 4+ VIEW COMPARISON:  None. FINDINGS: Five lumbar levels. Mild levocurvature, apex L2-3. Mild straightening of normal lumbar lordosis. No spondylolisthesis or spondylolysis. No abnormally widened, jumped or perched facets. No acute fracture or vertebral body height loss. Posterior elements and transverse processes appear intact. Disc spaces appear fairly well preserved. Some early facet degenerative changes L4-5, L5-S1. Visible bones of the pelvis are intact and congruent. Soft tissues are unremarkable. IMPRESSION: 1. No acute osseous abnormality. 2. Mild levocurvature, apex L2-3. 3. Early facet degenerative changes L4-5, L5-S1. Electronically Signed   By: Lovena Le M.D.   On: 12/20/2020 03:10   I, Lynne Leader, personally (independently) visualized and performed the interpretation of the images attached in this note.  X-ray images T-spine and C-spine obtained today personally and independently interpreted  C-spine: Reversal of cervical lordosis indicating spasm.  T-spine: Thoracolumbar scoliosis convex right.  More significant in thoracic spine and lumbar spine.  Await formal radiology review  Recent Results (from the past 2160 hour(s))  TSH     Status: Abnormal  Collection Time: 12/19/20  4:54 PM  Result Value Ref Range   TSH 4.52 (H) 0.35  - 4.50 uIU/mL  Sedimentation rate     Status: None   Collection Time: 12/19/20  4:54 PM  Result Value Ref Range   Sed Rate 6 0 - 20 mm/hr  C-reactive protein     Status: None   Collection Time: 12/19/20  4:54 PM  Result Value Ref Range   CRP <1.0 0.5 - 20.0 mg/dL  CBC     Status: None   Collection Time: 12/19/20  4:54 PM  Result Value Ref Range   WBC 8.6 4.0 - 10.5 K/uL   RBC 4.45 3.87 - 5.11 Mil/uL   Platelets 258.0 150.0 - 400.0 K/uL   Hemoglobin 13.2 12.0 - 15.0 g/dL   HCT 38.9 36.0 - 46.0 %   MCV 87.4 78.0 - 100.0 fl   MCHC 34.0 30.0 - 36.0 g/dL   RDW 13.4 11.5 - 15.5 %  Vitamin B12     Status: None   Collection Time: 12/19/20  4:54 PM  Result Value Ref Range   Vitamin B-12 279 211 - 911 pg/mL  Basic metabolic panel     Status: None   Collection Time: 12/19/20  4:54 PM  Result Value Ref Range   Sodium 139 135 - 145 mEq/L   Potassium 3.7 3.5 - 5.1 mEq/L   Chloride 105 96 - 112 mEq/L   CO2 23 19 - 32 mEq/L   Glucose, Bld 77 70 - 99 mg/dL   BUN 11 6 - 23 mg/dL   Creatinine, Ser 0.57 0.40 - 1.20 mg/dL   GFR 128.24 >60.00 mL/min    Comment: Calculated using the CKD-EPI Creatinine Equation (2021)   Calcium 9.6 8.4 - 10.5 mg/dL     Impression and Recommendations:    Assessment and Plan: 25 y.o. female with chronic low back thoracic spine and trapezius pain.  Patient has failed adequate trial of physical therapy for her low back.  At this point neck step would be to proceed to MRI for further investigation of cause of back pain and for potential facet injection planning.  Her primary care provider has done an excellent job of investigation so far with a rheumatologic work-up that was effectively negative.  Additionally primary care provider has recommended and prescribed Cymbalta.  Patient has yet to start it I do recommend starting this medication after discussion with [name].  After discussion she will start Cymbalta as this may be helpful.  Recheck after MRI.  Thoracic  back pain and pain in trapezius and neck: X-rays today show evidence of spasm in cervical spine and scoliosis of thoracic spine.  She has had some limited physical therapy for these issues but not had dedicated physical therapy for the neck and trapezius spasm.  Plan to reorder PT for these issues on a more dedicated basis.  I do think PT is probably the best approach for the trapezius dysfunction.  Cymbalta may be helpful as well.   Rheumatologic work-up: As noted above has had a good rheumatologic work-up that was effectively negative.  If not any improvement with further investigation and treatment would consider HLA-B27 to evaluate for spondyloarthropathy although this is unlikely to be positive.  PDMP not reviewed this encounter. Orders Placed This Encounter  Procedures  . DG Thoracic Spine 2 View    Standing Status:   Future    Number of Occurrences:   1    Standing Expiration Date:  12/28/2021    Order Specific Question:   Reason for Exam (SYMPTOM  OR DIAGNOSIS REQUIRED)    Answer:   eval pain tspine    Order Specific Question:   Is patient pregnant?    Answer:   No    Order Specific Question:   Preferred imaging location?    Answer:   [facility]  . DG Cervical Spine 2 or 3 views    Standing Status:   Future    Number of Occurrences:   1    Standing Expiration Date:   12/28/2021    Order Specific Question:   Reason for Exam (SYMPTOM  OR DIAGNOSIS REQUIRED)    Answer:   eval pain cspine    Order Specific Question:   Is patient pregnant?    Answer:   No    Order Specific Question:   Preferred imaging location?    Answer:   [facility]  . MR Lumbar Spine Wo Contrast    Standing Status:   Future    Standing Expiration Date:   12/28/2021    Order Specific Question:   What is the patient's sedation requirement?    Answer:   No Sedation    Order Specific Question:   Does the patient have a pacemaker or implanted devices?    Answer:   No    Order Specific Question:    Preferred imaging location?    Answer:   [facility] (table limit-350lbs)  . Ambulatory referral to Physical Therapy    Referral Priority:   Routine    Referral Type:   Physical Medicine    Referral Reason:   Specialty Services Required    Requested Specialty:   Physical Therapy   No orders of the defined types were placed in this encounter.   Discussed warning signs or symptoms. Please see discharge instructions. Patient expresses understanding.   The above documentation has been reviewed and is accurate and complete Lynne Leader, M.D.

## 2020-12-28 ENCOUNTER — Ambulatory Visit (INDEPENDENT_AMBULATORY_CARE_PROVIDER_SITE_OTHER): Payer: BC Managed Care – PPO | Admitting: Family Medicine

## 2020-12-28 ENCOUNTER — Other Ambulatory Visit: Payer: Self-pay

## 2020-12-28 ENCOUNTER — Ambulatory Visit (INDEPENDENT_AMBULATORY_CARE_PROVIDER_SITE_OTHER): Payer: BC Managed Care – PPO

## 2020-12-28 ENCOUNTER — Encounter: Payer: Self-pay | Admitting: Family Medicine

## 2020-12-28 VITALS — BP 112/78 | HR 61 | Ht 68.25 in | Wt 145.0 lb

## 2020-12-28 DIAGNOSIS — M545 Low back pain, unspecified: Secondary | ICD-10-CM

## 2020-12-28 DIAGNOSIS — M542 Cervicalgia: Secondary | ICD-10-CM

## 2020-12-28 DIAGNOSIS — M546 Pain in thoracic spine: Secondary | ICD-10-CM

## 2020-12-28 DIAGNOSIS — M4184 Other forms of scoliosis, thoracic region: Secondary | ICD-10-CM | POA: Diagnosis not present

## 2020-12-28 DIAGNOSIS — G8929 Other chronic pain: Secondary | ICD-10-CM

## 2020-12-28 NOTE — Patient Instructions (Addendum)
Thank you for coming in today.  Please get an Xray today before you leave  I've referred you to Physical Therapy.  Let us know if you don't hear from them in one week.  Start the cymbalta.   You should hear from MRI scheduling within 1 week. If you do not hear please let me know.   Recheck after MRI.

## 2020-12-29 NOTE — Progress Notes (Signed)
X-ray cervical spine shows reversal of the normal curvature of the neck indicating that your neck is in spasm.  Physical therapy should help with this.

## 2020-12-29 NOTE — Progress Notes (Signed)
X-ray thoracic spine shows scoliosis a little bit worse in the thoracic spine and lumbar spine.

## 2021-01-04 DIAGNOSIS — R059 Cough, unspecified: Secondary | ICD-10-CM | POA: Diagnosis not present

## 2021-01-04 DIAGNOSIS — J0100 Acute maxillary sinusitis, unspecified: Secondary | ICD-10-CM | POA: Diagnosis not present

## 2021-01-07 ENCOUNTER — Ambulatory Visit (INDEPENDENT_AMBULATORY_CARE_PROVIDER_SITE_OTHER): Payer: BC Managed Care – PPO

## 2021-01-07 ENCOUNTER — Other Ambulatory Visit: Payer: Self-pay

## 2021-01-07 DIAGNOSIS — M545 Low back pain, unspecified: Secondary | ICD-10-CM

## 2021-01-07 DIAGNOSIS — G8929 Other chronic pain: Secondary | ICD-10-CM | POA: Diagnosis not present

## 2021-01-09 NOTE — Progress Notes (Signed)
Radiology read the MRI of the lumbar spine is normal.  However I do think the facet joints look a little irritated to me.  Those were the potential targets for injections.  Please return to clinic to discuss the MRI results and plan for potential injections.

## 2021-01-11 ENCOUNTER — Other Ambulatory Visit: Payer: Self-pay

## 2021-01-11 ENCOUNTER — Telehealth (INDEPENDENT_AMBULATORY_CARE_PROVIDER_SITE_OTHER): Payer: BC Managed Care – PPO | Admitting: Family Medicine

## 2021-01-11 ENCOUNTER — Ambulatory Visit (INDEPENDENT_AMBULATORY_CARE_PROVIDER_SITE_OTHER): Payer: BC Managed Care – PPO | Admitting: Family Medicine

## 2021-01-11 ENCOUNTER — Encounter: Payer: Self-pay | Admitting: Family Medicine

## 2021-01-11 VITALS — BP 110/74 | HR 60 | Ht 68.25 in | Wt 151.8 lb

## 2021-01-11 VITALS — Wt 145.0 lb

## 2021-01-11 DIAGNOSIS — J019 Acute sinusitis, unspecified: Secondary | ICD-10-CM

## 2021-01-11 DIAGNOSIS — G8929 Other chronic pain: Secondary | ICD-10-CM

## 2021-01-11 DIAGNOSIS — M545 Low back pain, unspecified: Secondary | ICD-10-CM

## 2021-01-11 MED ORDER — ALBUTEROL SULFATE HFA 108 (90 BASE) MCG/ACT IN AERS: 2.0000 | INHALATION_SPRAY | RESPIRATORY_TRACT | 0 refills | Status: DC | PRN

## 2021-01-11 MED ORDER — AZITHROMYCIN 250 MG PO TABS: ORAL_TABLET | ORAL | 0 refills | Status: DC

## 2021-01-11 NOTE — Progress Notes (Signed)
   Subjective:    Patient ID: Mary Bright, female    DOB: 17-Mar-1996, 25 y.o.   MRN: [id number]  HPI Virtual Visit via Video Note  I connected with the patient on 01/11/21 at  9:30 AM EDT by a video enabled telemedicine application and verified that I am speaking with the correct person using two identifiers.  Location patient: home Location provider:work or home office Persons participating in the virtual visit: patient, provider  I discussed the limitations of evaluation and management by telemedicine and the availability of in person appointments. The patient expressed understanding and agreed to proceed.   HPI: Here for 2 weeks of sinus pressure, PND, blowing green mucus from the nose, a dry cough,and nausea without vomiting. No SOB or fever. No body aches or diarrhea. She tested negative for the Covid virus a week ago. She has been taking Nyquil at home. Last week she went ot a [facility] and they gave her a week of Doxycycline and an inhaler. This has not helped much.    ROS: See pertinent positives and negatives per HPI.  Past Medical History:  Diagnosis Date  . Anxiety   . Asthma   . Depression    History of psych admission at age 64    Past Surgical History:  Procedure Laterality Date  . HAND SURGERY Right 2015    Family History  Problem Relation Age of Onset  . Depression Mother   . Bipolar disorder Maternal Aunt   . Bipolar disorder Maternal Grandfather   . Alcohol abuse Paternal Grandfather   . Healthy Father      Current Outpatient Medications:  .  albuterol (VENTOLIN HFA) 108 (90 Base) MCG/ACT inhaler, Inhale 2 puffs into the lungs every 4 (four) hours as needed for wheezing or shortness of breath., Disp: 8 g, Rfl: 0 .  azithromycin (ZITHROMAX) 250 MG tablet, As directed, Disp: 6 tablet, Rfl: 0 .  etonogestrel-ethinyl estradiol (NUVARING) 0.12-0.015 MG/24HR vaginal ring, Insert vaginally and leave in place for 3 consecutive weeks, then remove for 1  week., Disp: 4 each, Rfl: 3  EXAM:  VITALS per patient if applicable:  GENERAL: alert, oriented, appears well and in no acute distress  HEENT: atraumatic, conjunttiva clear, no obvious abnormalities on inspection of external nose and ears  NECK: normal movements of the head and neck  LUNGS: on inspection no signs of respiratory distress, breathing rate appears normal, no obvious gross SOB, gasping or wheezing  CV: no obvious cyanosis  MS: moves all visible extremities without noticeable abnormality  PSYCH/NEURO: pleasant and cooperative, no obvious depression or anxiety, speech and thought processing grossly intact  ASSESSMENT AND PLAN: Sinusitis, we will treat this with a Zpack. She can add Mucinex BID. Recheck as needed.  Gershon Crane, MD  Discussed the following assessment and plan:  No diagnosis found.     I discussed the assessment and treatment plan with the patient. The patient was provided an opportunity to ask questions and all were answered. The patient agreed with the plan and demonstrated an understanding of the instructions.   The patient was advised to call back or seek an in-person evaluation if the symptoms worsen or if the condition fails to improve as anticipated.     Review of Systems     Objective:   Physical Exam        Assessment & Plan:

## 2021-01-11 NOTE — Progress Notes (Signed)
I, Philbert Riser, LAT, ATC acting as a scribe for Clementeen Graham, MD.  Mary Bright is a 25 y.o. female who presents to [facility] today for f/u chronic low back pain and L-spine MRI review. 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  Pt fail adequate trial of PT, 7 visits. Pt's PCP has completed a rheumatologic work up which was negative. Pt was last seen by Dr. Denyse Amass on 12/28/20 and was advised to start Cymbalta and to proceed to MRI to further characterize the cause of her pain. Today, pt reports low back pain is worse. Pt has not worked the past 2 days due to LBPn.   Pain is worse on the left compared to right.  Dx imaging: 01/07/21 L-spine MRI  12/19/20 L-spine, T-spine, & C-spine XR  Pertinent review of systems: No fevers or chills  Relevant historical information: Asthma   Exam:  BP 110/74 (BP Location: Right Arm, Patient Position: Sitting, Cuff Size: Normal)   Pulse 60   Ht 5' 8.25" (1.734 m)   Wt 151 lb 12.8 oz (68.9 kg)   LMP 12/21/2020   SpO2 99%   BMI 22.91 kg/m  General: Well Developed, well nourished, and in no acute distress.   MSK: L-spine normal-appearing normal lumbar motion.    Lab and Radiology Results  EXAM: MRI LUMBAR SPINE WITHOUT CONTRAST  TECHNIQUE: Multiplanar, multisequence MR imaging of the lumbar spine was performed. No intravenous contrast was administered.  COMPARISON:  None.  FINDINGS: Segmentation:  Standard.  Alignment:  Physiologic.  Vertebrae:  No fracture, evidence of discitis, or bone lesion.  Conus medullaris and cauda equina: Conus extends to the L1 level. Conus and cauda equina appear normal.  Paraspinal and other soft tissues: Negative.  Disc levels:  No disc herniation, spinal canal stenosis or neural foraminal stenosis.  IMPRESSION: Normal lumbar spine MRI.   Electronically Signed   By: Deatra Robinson M.D.   On: 01/09/2021  02:40  I, Clementeen Graham, personally (independently) visualized and performed the interpretation of the images attached in this note. Per my read patient does have some DJD of L3-4 facets and L5-S1 facet joints.     Assessment and Plan: 25 y.o. female with chronic low back pain not improving with typical conservative management.  MRI is pretty normal-appearing.  She does have some possible facet degeneration which may be a source of pain.  Plan for trial of facet joints.  Her pain is a bit worse than the left so we will start with L3-4 and L5-S1 facet injections on the left and proceed from there.  Additionally at the last visit Cymbalta was prescribed.  She has not been taking it consistently because she had to take some antibiotics and was worried about the possible interactions.  Advised her that Cymbalta will not work very well unless she is very consistent about taking it.  Reassess in the near future.   PDMP not reviewed this encounter. Orders Placed This Encounter  Procedures  . DG FACET JT INJ L /S SINGLE LEVEL LEFT W/FL/CT    Standing Status:   Future    Standing Expiration Date:   01/11/2022    Order Specific Question:   Reason for Exam (SYMPTOM  OR DIAGNOSIS REQUIRED)    Answer:   left L5/S1    Order Specific Question:   Is the patient pregnant?    Answer:   No    Order Specific  Question:   Preferred Imaging Location?    Answer:   [facility]    Order Specific Question:   Radiology Contrast Protocol - do NOT remove file path    Answer:   [email or web address]  . DG FACET JT INJ L /S  2ND LEVEL LEFT W/FL/CT    Standing Status:   Future    Standing Expiration Date:   01/11/2022    Order Specific Question:   Reason for Exam (SYMPTOM  OR DIAGNOSIS REQUIRED)    Answer:   left L3/L4    Order Specific Question:   Is the patient pregnant?    Answer:   No    Order Specific Question:   Preferred Imaging Location?    Answer:   [facility]    Order Specific Question:   Radiology Contrast Protocol - do NOT remove file path    Answer:   \\[email or web address]   No orders of the defined types were placed in this encounter.    Discussed warning signs or symptoms. Please see discharge instructions. Patient expresses understanding.   The above documentation has been reviewed and is accurate and complete Clementeen Graham, M.D. Total encounter time 20 minutes including face-to-face time with the patient and, reviewing past medical record, and charting on the date of service.   Reviewed MRI treatment plan and options.

## 2021-01-11 NOTE — Patient Instructions (Addendum)
Thank you for coming in today.  Please call [facility] at [phone] to schedule your spine injection.   Let me know how you do after the injection.     Facet Joint Block The facet joints connect the bones of the spine (vertebrae). They make it possible for you to bend, twist, and make other movements with your spine. They also keep you from bending too far, twisting too far, and making other extreme movements. A facet joint block is a procedure in which a numbing medicine (anesthetic) is injected into a facet joint. In many cases, an anti-inflammatory medicine (steroid) is also injected. A facet joint block may be done:  To diagnose neck or back pain. If the pain gets better after a facet joint block, it means the pain is probably coming from the facet joint. If the pain does not get better, it means the pain is probably not coming from the facet joint.  To relieve neck or back pain that is caused by an inflamed facet joint. A facet joint block is only done to relieve pain if the pain does not improve with other methods, such as medicine, exercise programs, and physical therapy. Tell a health care provider about:  Any allergies you have.  All medicines you are taking, including vitamins, herbs, eye drops, creams, and over-the-counter medicines.  Any problems you or family members have had with anesthetic medicines.  Any blood disorders you have.  Any surgeries you have had.  Any medical conditions you have or have had.  Whether you are pregnant or may be pregnant. What are the risks? Generally, this is a safe procedure. However, problems may occur, including:  Bleeding.  Injury to a nerve near the injection site.  Pain at the injection site.  Weakness or numbness in areas controlled by nerves near the injection site.  Infection.  Temporary fluid retention.  Allergic reactions to medicines or dyes.  Injury to other structures or organs near the injection  site. What happens before the procedure? Medicines Ask your health care provider about:  Changing or stopping your regular medicines. This is especially important if you are taking diabetes medicines or blood thinners.  Taking medicines such as aspirin and ibuprofen. These medicines can thin your blood. Do not take these medicines unless your health care provider tells you to take them.  Taking over-the-counter medicines, vitamins, herbs, and supplements. Eating and drinking Follow instructions from your health care provider about eating and drinking, which may include:  8 hours before the procedure - stop eating heavy meals or foods, such as meat, fried foods, or fatty foods.  6 hours before the procedure - stop eating light meals or foods, such as toast or cereal.  6 hours before the procedure - stop drinking milk or drinks that contain milk.  2 hours before the procedure - stop drinking clear liquids. Staying hydrated Follow instructions from your health care provider about hydration, which may include:  Up to 2 hours before the procedure - you may continue to drink clear liquids, such as water, clear fruit juice, black coffee, and plain tea. General instructions  Do not use any products that contain nicotine or tobacco for at least 4-6 weeks before the procedure. These products include cigarettes, e-cigarettes, and chewing tobacco. If you need help quitting, ask your health care provider.  Plan to have someone take you home from the hospital or clinic.  Ask your health care provider: ? How your surgery site will be marked. ?  What steps will be taken to help prevent infection. These may include:  Removing hair at the surgery site.  Washing skin with a germ-killing soap.  Receiving antibiotic medicine. What happens during the procedure?  You will put on a hospital gown.  You will lie on your stomach on an X-ray table. You may be asked to lie in a different position if an  injection will be made in your neck.  Machines will be used to monitor your oxygen levels, heart rate, and blood pressure.  Your skin will be cleaned.  If an injection will be made in your neck, an IV will be inserted into one of your veins. Fluids and medicine will flow directly into your body through the IV.  A numbing medicine (local anesthetic) will be applied to your skin. Your skin may sting or burn for a moment.  A video X-ray machine (fluoroscopy) will be used to find the joint. In some cases, a CT scan may be used.  A contrast dye may be injected into the facet joint area to help find the joint.  When the joint is located, an anesthetic will be injected into the joint through the needle.  Your health care provider will ask you whether you feel pain relief. ? If you feel relief, a steroid may be injected to provide pain relief for a longer period of time. ? If you do not feel relief or feel only partial relief, additional injections of an anesthetic may be made in other facet joints.  The needle will be removed.  Your skin will be cleaned.  A bandage (dressing) will be applied over each injection site. The procedure may vary among health care providers and hospitals.   What happens after the procedure?  Your blood pressure, heart rate, breathing rate, and blood oxygen level will be monitored until you leave the hospital or clinic.  You will lie down and rest for a period of time. Summary  A facet joint block is a procedure in which a numbing medicine (anesthetic) is injected into a facet joint. An anti-inflammatory medicine (stereoid) may also be injected.  Follow instructions from your health care provider about medicines and eating and drinking before the procedure.  Do not use any products that contain nicotine or tobacco for at least 4-6 weeks before the procedure.  You will lie on your stomach for the procedure, but you may be asked to lie in a different position  if an injection will be made in your neck.  When the joint is located, an anesthetic will be injected into the joint through the needle. This information is not intended to replace advice given to you by your health care provider. Make sure you discuss any questions you have with your health care provider. Document Revised: 01/22/2019 Document Reviewed: 09/05/2018 Elsevier Patient Education  2021 [employer].

## 2021-01-12 ENCOUNTER — Ambulatory Visit
Admission: RE | Admit: 2021-01-12 | Discharge: 2021-01-12 | Disposition: A | Discharge status: Home / Self Care | Payer: BC Managed Care – PPO | Source: Ambulatory Visit | Attending: Family Medicine | Admitting: Family Medicine

## 2021-01-12 ENCOUNTER — Other Ambulatory Visit: Payer: Self-pay

## 2021-01-12 DIAGNOSIS — G8929 Other chronic pain: Secondary | ICD-10-CM

## 2021-01-12 DIAGNOSIS — M47817 Spondylosis without myelopathy or radiculopathy, lumbosacral region: Secondary | ICD-10-CM | POA: Diagnosis not present

## 2021-01-12 MED ORDER — METHYLPREDNISOLONE ACETATE 40 MG/ML INJ SUSP (RADIOLOG
120.0000 mg | Freq: Once | INTRAMUSCULAR | Status: AC
  Administered 2021-01-12: 120 mg via INTRA_ARTICULAR

## 2021-01-12 MED ORDER — IOPAMIDOL (ISOVUE-M 200) INJECTION 41%
1.0000 mL | Freq: Once | INTRAMUSCULAR | Status: AC
  Administered 2021-01-12: 1 mL via INTRA_ARTICULAR

## 2021-01-12 NOTE — Discharge Instructions (Signed)
Joint Injection Discharge Instructions  1. After your joint injection, use ice to the affected area for the next 24 hours as a temporary increase in pain is not uncommon for a day or two after your procedure.  2. Resume all medications unless otherwise instructed.  3. Common side effects of steroids include facial flushing or redness, restlessness or inability to sleep and an increase in your blood sugar if you are a diabetic.    4. Follow up with the ordering physician for post care.  5. If you have any of the following please call [phone]:      Temperature greater than 101     Pain, redness or swelling at the injection sitePost Procedure Spinal Discharge Instruction Sheet  1. You may resume a regular diet and any medications that you routinely take (including pain medications).  2. No driving day of procedure.  3. Light activity throughout the rest of the day.  Do not do any strenuous work, exercise, bending or lifting.  The day following the procedure, you can resume normal physical activity but you should refrain from exercising or physical therapy for at least three days thereafter.   Common Side Effects:   Headaches- take your usual medications as directed by your physician.  Increase your fluid intake.  Caffeinated beverages may be helpful.  Lie flat in bed until your headache resolves.   Restlessness or inability to sleep- you may have trouble sleeping for the next few days.  Ask your referring physician if you need any medication for sleep.   Facial flushing or redness- should subside within a few days.   Increased pain- a temporary increase in pain a day or two following your procedure is not unusual.  Take your pain medication as prescribed by your referring physician.   Leg cramps  Please contact our office at [phone] for the following symptoms:  Fever greater than 100 degrees.  Headaches unresolved with medication after 2-3 days.  Increased  swelling, pain, or redness at injection site.

## 2021-01-16 ENCOUNTER — Ambulatory Visit (HOSPITAL_BASED_OUTPATIENT_CLINIC_OR_DEPARTMENT_OTHER): Payer: BC Managed Care – PPO | Attending: Family Medicine | Admitting: Physical Therapy

## 2021-01-18 ENCOUNTER — Ambulatory Visit: Payer: BC Managed Care – PPO | Admitting: Family Medicine

## 2021-01-23 NOTE — Progress Notes (Signed)
Mary Bright, am serving as a Neurosurgeon for Dr. Clementeen Graham.  Mary Bright is a 25 y.o. female who presents to [facility] at [facility] today for f/u chronic bilat low back pain. Pt was last seen by Dr. Denyse Amass on 01/11/21 and was advised to plan for trial of facet injections at left L3-4 and left L5-S1, which were done on 01/12/21. Pt was also advised to consistently take her Cymbalta. Today, pt reports that the injection did not help. States the first couple days felt better but was told that could be because of the lidocaine in the injection. Patient still wearing a back brace to help with pain. Patient just wanting to know where to go from here.   She notes that she is no longer is taking Cymbalta.  Dx imaging: 01/07/21 L-spine MRI             12/19/20 L-spine, T-spine, & C-spine XR  Pertinent review of systems: No fevers or chills  Relevant historical information: Asthma   Exam:  BP 100/70 (BP Location: Left Arm, Patient Position: Sitting, Cuff Size: Normal)   Pulse 66   Ht 5' 8.25" (1.734 m)   Wt 144 lb (65.3 kg)   SpO2 99%   BMI 21.74 kg/m  General: Well Developed, well nourished, and in no acute distress.   MSK: L-spine nontender normal lumbar motion    Lab and Radiology Results  EXAM: MRI LUMBAR SPINE WITHOUT CONTRAST  TECHNIQUE: Multiplanar, multisequence MR imaging of the lumbar spine was performed. No intravenous contrast was administered.  COMPARISON: None.  FINDINGS: Segmentation: Standard.  Alignment: Physiologic.  Vertebrae: No fracture, evidence of discitis, or bone lesion.  Conus medullaris and cauda equina: Conus extends to the L1 level. Conus and cauda equina appear normal.  Paraspinal and other soft tissues: Negative.  Disc levels:  No disc herniation, spinal canal stenosis or neural foraminal stenosis.  IMPRESSION: Normal lumbar spine MRI.   Electronically Signed By: Deatra Robinson M.D. On:  01/09/2021 02:40  I, Clementeen Graham, personally (independently) visualized and performed the interpretation of the images attached in this note.  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 female with chronic low back pain more on the left. Mary Bright did have improvement with facet injection at left L3-L4 and L5-S1 but only temporary.  She therefore is a reasonable candidate to proceed with trial of medial branch block and ablation at these levels.  Discussed planned procedure with patient who expresses understanding agreement we will proceed with next steps.  If this does not help I think her neck step should be referral to pain management for further evaluation and for second opinion.   PDMP not reviewed this encounter. Orders Placed This Encounter  Procedures  . DG Facet Jt Neuro Destruct Sing L/S w/Img Guide    Order Specific Question:   Reason for exam:    Answer:   Need confirmatory medial branch block, and if responsive, radiofrequency ablation of: left L3-4 and L5-S1    Order Specific Question:   Is the patient pregnant?    Answer:   No    Order Specific Question:   Preferred imaging location?    Answer:   [facility]  . DG Facet Jt Neuro Destruct Sing L/S w/Img Guide    Order Specific Question:   Reason for exam:    Answer:   Need confirmatory medial  branch block, and if responsive, radiofrequency ablation of: L3-4 and L5-S1 left    Order Specific Question:   Is the patient pregnant?    Answer:   No    Order Specific Question:   Preferred imaging location?    Answer:   [facility]   No orders of the defined types were placed in this encounter.    Discussed warning signs or symptoms. Please see discharge instructions. Patient expresses understanding.   The above documentation has been reviewed and is accurate and complete Clementeen Graham, M.D.

## 2021-01-24 ENCOUNTER — Ambulatory Visit (INDEPENDENT_AMBULATORY_CARE_PROVIDER_SITE_OTHER): Payer: BC Managed Care – PPO | Admitting: Family Medicine

## 2021-01-24 ENCOUNTER — Encounter: Payer: Self-pay | Admitting: Family Medicine

## 2021-01-24 ENCOUNTER — Other Ambulatory Visit: Payer: Self-pay

## 2021-01-24 VITALS — BP 100/70 | HR 66 | Ht 68.25 in | Wt 144.0 lb

## 2021-01-24 DIAGNOSIS — G8929 Other chronic pain: Secondary | ICD-10-CM | POA: Diagnosis not present

## 2021-01-24 DIAGNOSIS — M545 Low back pain, unspecified: Secondary | ICD-10-CM

## 2021-01-24 NOTE — Patient Instructions (Signed)
Thank you for coming in today.  Please call [facility] at [phone] to schedule your spine injection.   Keep me updated.   Make sure to do some activity after the nerve block to see how you feel.   Facet Blocks Patient Information  Description: The facets are joints in the spine between the vertebrae.  Like any joints in the body, facets can become irritated and painful.  Arthritis can also effect the facets.  By injecting steroids and local anesthetic in and around these joints, we can temporarily block the nerve supply to them.  Steroids act directly on irritated nerves and tissues to reduce selling and inflammation which often leads to decreased pain.  Facet blocks may be done anywhere along the spine from the neck to the low back depending upon the location of your pain.   After numbing the skin with local anesthetic (like Novocaine), a small needle is passed onto the facet joints under x-ray guidance.  You may experience a sensation of pressure while this is being done.  The entire block usually lasts about 15-25 minutes.   Conditions which may be treated by facet blocks:   Low back/buttock pain  Neck/shoulder pain  Certain types of headaches  Preparation for the injection:  1. Do not eat any solid food or dairy products within 8 hours of your appointment. 2. You may drink clear liquid up to 3 hours before appointment.  Clear liquids include water, black coffee, juice or soda.  No milk or cream please. 3. You may take your regular medication, including pain medications, with a sip of water before your appointment.  Diabetics should hold regular insulin (if taken separately) and take 1/2 normal NPH dose the morning of the procedure.  Carry some sugar containing items with you to your appointment. 4. A driver must accompany you and be prepared to drive you home after your procedure. 5. Bring all your current medications with you. 6. An IV may be inserted and sedation may  be given at the discretion of the physician. 7. A blood pressure cuff, EKG and other monitors will often be applied during the procedure.  Some patients may need to have extra oxygen administered for a short period. 8. You will be asked to provide medical information, including your allergies and medications, prior to the procedure.  We must know immediately if you are taking blood thinners (like Coumadin/Warfarin) or if you are allergic to IV iodine contrast (dye).  We must know if you could possible be pregnant.  Possible side-effects:   Bleeding from needle site  Infection (rare, may require surgery)  Nerve injury (rare)  Numbness & tingling (temporary)  Difficulty urinating (rare, temporary)  Spinal headache (a headache worse with upright posture)  Light-headedness (temporary)  Pain at injection site (serveral days)  Decreased blood pressure (rare, temporary)  Weakness in arm/leg (temporary)  Pressure sensation in back/neck (temporary)   Call if you experience:   Fever/chills associated with headache or increased back/neck pain  Headache worsened by an upright position  New onset, weakness or numbness of an extremity below the injection site  Hives or difficulty breathing (go to the emergency room)  Inflammation or drainage at the injection site(s)  Severe back/neck pain greater than usual  New symptoms which are concerning to you  Please note:  Although the local anesthetic injected can often make your back or neck feel good for several hours after the injection, the pain will likely return. It takes 3-7 days  for steroids to work.  You may not notice any pain relief for at least one week.  If effective, we will often do a series of 2-3 injections spaced 3-6 weeks apart to maximally decrease your pain.  After the initial series, you may be a candidate for a more permanent nerve block of the facets.

## 2021-01-25 ENCOUNTER — Other Ambulatory Visit: Payer: Self-pay | Admitting: Family Medicine

## 2021-01-25 DIAGNOSIS — G8929 Other chronic pain: Secondary | ICD-10-CM

## 2021-01-25 DIAGNOSIS — M545 Low back pain, unspecified: Secondary | ICD-10-CM

## 2021-02-08 ENCOUNTER — Other Ambulatory Visit: Payer: Self-pay | Admitting: Family Medicine

## 2021-02-08 ENCOUNTER — Ambulatory Visit
Admission: RE | Admit: 2021-02-08 | Discharge: 2021-02-08 | Disposition: A | Discharge status: Home / Self Care | Payer: BC Managed Care – PPO | Source: Ambulatory Visit | Attending: Family Medicine | Admitting: Family Medicine

## 2021-02-08 ENCOUNTER — Other Ambulatory Visit: Payer: Self-pay

## 2021-02-08 DIAGNOSIS — G8929 Other chronic pain: Secondary | ICD-10-CM

## 2021-02-08 DIAGNOSIS — M545 Low back pain, unspecified: Secondary | ICD-10-CM

## 2021-02-08 NOTE — Discharge Instructions (Signed)

## 2021-02-13 NOTE — Progress Notes (Signed)
Mary Bright, am serving as a Education administrator for Dr. Lynne Bright.  Mary Bright is a 25 y.o. female who presents to [place] [facility] today for f/u chronic bilat low back pain. Pt had facet injections at left L3-4 and left L5-S1, which were done on 01/12/21 and again on 02/08/21. Pt was last seen by Dr. Georgina Bright on 01/24/21 and was advised to proceed w/ trial of medial branch block and ablation that she had on 02/08/21 (left L3-4 and L5-S1). Today, pt reports  That the nerve root block did not work and the pain is worse. States in the area it was done it is a tighter feeling.   Has had trial of physical therapy with not much benefit.  Very limited trial of Cymbalta with little benefit.  Dx imaging:01/07/21 L-spine MRI 12/19/20 L-spine, T-spine, & C-spineXR  Pertinent review of systems: No fevers or chills  Relevant historical information: History of anxiety and depression.   Exam:  BP 112/60 (BP Location: Left Arm, Patient Position: Sitting, Cuff Size: Normal)   Pulse 71   Ht 5' 8.25" (1.734 m)   Wt 145 lb (65.8 kg)   SpO2 98%   BMI 21.89 kg/m  General: Well Developed, well nourished, and in no acute distress.   MSK: L-spine normal motion    Lab and Radiology Results EXAM: MRI LUMBAR SPINE WITHOUT CONTRAST  TECHNIQUE: Multiplanar, multisequence MR imaging of the lumbar spine was performed. No intravenous contrast was administered.  COMPARISON:  None.  FINDINGS: Segmentation:  Standard.  Alignment:  Physiologic.  Vertebrae:  No fracture, evidence of discitis, or bone lesion.  Conus medullaris and cauda equina: Conus extends to the L1 level. Conus and cauda equina appear normal.  Paraspinal and other soft tissues: Negative.  Disc levels:  No disc herniation, spinal canal stenosis or neural foraminal stenosis.  IMPRESSION: Normal lumbar spine MRI.   Electronically Signed   By: Mary Bright M.D.   On: 01/09/2021  02:40.xray     Assessment and Plan: 25 y.o. female with chronic low back pain. Mary Bright is failing typical conservative management.  She has had trials of physical therapy.  She had a relatively reasonable rheumatologic work-up with no significant abnormality.  She is even had trial of facet medial branch block with no improvement and canceled ablation.  This point she has bothersome chronic back pain that is interfering with her quality of life.  I do not have much more to offer however we will proceed with a little bit further work-up.  We will check for ankylosing spondylitis with HLA-B27.  Additionally trial of Lyrica.  Lastly referral to pain management for second opinion.   PDMP not reviewed this encounter. Orders Placed This Encounter  Procedures  . HLA-B27 antigen    Standing Status:   Future    Number of Occurrences:   1    Standing Expiration Date:   02/14/2022  . Ambulatory referral to Pain Clinic    Referral Priority:   Routine    Referral Type:   Consultation    Referral Reason:   Specialty Services Required    Requested Specialty:   Pain Medicine    Number of Visits Requested:   1   Meds ordered this encounter  Medications  . pregabalin (LYRICA) 75 MG capsule    Sig: Take 1 capsule (75 mg total) by mouth 2 (two) times daily as needed (nerve pain).    Dispense:  60 capsule  Refill:  3     Discussed warning signs or symptoms. Please see discharge instructions. Patient expresses understanding.   The above documentation has been reviewed and is accurate and complete Mary Bright, M.D.

## 2021-02-14 ENCOUNTER — Other Ambulatory Visit: Payer: Self-pay

## 2021-02-14 ENCOUNTER — Ambulatory Visit (INDEPENDENT_AMBULATORY_CARE_PROVIDER_SITE_OTHER): Payer: BC Managed Care – PPO | Admitting: Family Medicine

## 2021-02-14 ENCOUNTER — Encounter: Payer: Self-pay | Admitting: Family Medicine

## 2021-02-14 VITALS — BP 112/60 | HR 71 | Ht 68.25 in | Wt 145.0 lb

## 2021-02-14 DIAGNOSIS — M545 Low back pain, unspecified: Secondary | ICD-10-CM

## 2021-02-14 DIAGNOSIS — G8929 Other chronic pain: Secondary | ICD-10-CM

## 2021-02-14 MED ORDER — PREGABALIN 75 MG PO CAPS: 75.0000 mg | ORAL_CAPSULE | Freq: Two times a day (BID) | ORAL | 3 refills | Status: DC | PRN

## 2021-02-14 NOTE — Patient Instructions (Signed)
Thank you for coming in today.  Please get labs today before you leave  You should hear about the pain people soon.  Let me know if you do not.   Please get labs today before you leave  Try the lyrica up to 2x daily. It should quickly.

## 2021-02-15 LAB — HLA-B27 ANTIGEN: HLA-B27 Antigen: NEGATIVE

## 2021-02-16 NOTE — Progress Notes (Signed)
That special rheumatologic test was negative

## 2021-02-18 ENCOUNTER — Encounter: Payer: Self-pay | Admitting: Family Medicine

## 2021-02-21 ENCOUNTER — Encounter: Payer: Self-pay | Admitting: Physical Medicine & Rehabilitation

## 2021-02-21 MED ORDER — PREGABALIN 75 MG PO CAPS: 150.0000 mg | ORAL_CAPSULE | Freq: Two times a day (BID) | ORAL | 3 refills | Status: DC | PRN

## 2021-02-22 ENCOUNTER — Encounter: Payer: Self-pay | Admitting: Family Medicine

## 2021-03-01 ENCOUNTER — Other Ambulatory Visit: Payer: Self-pay

## 2021-03-01 ENCOUNTER — Ambulatory Visit: Admit: 2021-03-01 | Disposition: A | Discharge status: Home / Self Care | Payer: Self-pay

## 2021-03-01 ENCOUNTER — Ambulatory Visit
Admission: EM | Admit: 2021-03-01 | Discharge: 2021-03-01 | Disposition: A | Discharge status: Home / Self Care | Payer: BC Managed Care – PPO | Attending: Emergency Medicine | Admitting: Emergency Medicine

## 2021-03-01 DIAGNOSIS — R002 Palpitations: Secondary | ICD-10-CM | POA: Diagnosis not present

## 2021-03-01 NOTE — ED Triage Notes (Signed)
Pt states has been having a sinking feeling to center of chest lasting a sec since last night off and on. States feels like a flutter. Denies diaphoresis or SOB./ states hx of anxiety but this feels different. No distress noted.

## 2021-03-01 NOTE — Discharge Instructions (Addendum)
Please follow up with cardiology if symptoms persistent/recurrent Read attached on palpitations

## 2021-03-02 NOTE — ED Provider Notes (Signed)
[facility]    CSN: [id number] Arrival date & time: 03/01/21  1718      History   Chief Complaint Chief Complaint  Patient presents with  . Chest Pain    HPI Mary Bright is a 25 y.o. female history of anxiety, asthma, presenting today for evaluation of palpitations.  Reports that she has had a fluttering sensation to her central chest over the past 1 to 2 days.  Symptoms are brief and lasts approximately 1 second.  Initially she was feeling these multiple times a minute, but since has eased off.  Now filling a couple times per hour.  Sensation takes breath away, otherwise denies any shortness of breath or difficulty breathing.  Denies chest pain.  Denies history of any cardiac problems, hypertension, diabetes, tobacco use, vaping.  She does report daily marijuana use.  Denies other recreational drug use including cocaine.  Denies leg pain or leg swelling.  Denies history of DVT/PE.  Denies recent travel/immobilization.  Denies use of caffeine, herbal supplements.  HPI  Past Medical History:  Diagnosis Date  . Anxiety   . Asthma   . Depression    History of psych admission at age 29    Patient Active Problem List   Diagnosis Date Noted  . GAD (generalized anxiety disorder) 07/07/2019  . ADD (attention deficit disorder) 07/07/2019  . Childhood asthma 03/31/2014  . Enlarged lymph node 03/31/2014  . Dysmenorrhea 03/31/2014  . Depression 12/17/2011    Past Surgical History:  Procedure Laterality Date  . HAND SURGERY Right 2015    OB History   No obstetric history on file.      Home Medications    Prior to Admission medications   Medication Sig Start Date End Date Taking? Authorizing Provider  albuterol (VENTOLIN HFA) 108 (90 Base) MCG/ACT inhaler Inhale 2 puffs into the lungs every 4 (four) hours as needed for wheezing or shortness of breath. 01/11/21   Nelwyn Salisbury, MD  etonogestrel-ethinyl estradiol (NUVARING) 0.12-0.015 MG/24HR vaginal ring Insert  vaginally and leave in place for 3 consecutive weeks, then remove for 1 week. 06/28/20   [place], Betty G, MD  pregabalin (LYRICA) 75 MG capsule Take 2 capsules (150 mg total) by mouth 2 (two) times daily as needed (nerve pain). 02/21/21   Rodolph Bong, MD    Family History Family History  Problem Relation Age of Onset  . Depression Mother   . Bipolar disorder Maternal Aunt   . Bipolar disorder Maternal Grandfather   . Alcohol abuse Paternal Grandfather   . Healthy Father     Social History Social History   Tobacco Use  . Smoking status: Never Smoker  . Smokeless tobacco: Never Used  Vaping Use  . Vaping Use: Never used  Substance Use Topics  . Alcohol use: Yes    Comment: socially  . Drug use: No     Allergies   Patient has no known allergies.   Review of Systems Review of Systems  Constitutional: Negative for fatigue and fever.  HENT: Negative for congestion, sinus pressure and sore throat.   Eyes: Negative for photophobia, pain and visual disturbance.  Respiratory: Negative for cough and shortness of breath.   Cardiovascular: Positive for palpitations. Negative for chest pain and leg swelling.  Gastrointestinal: Negative for abdominal pain, nausea and vomiting.  Genitourinary: Negative for decreased urine volume and hematuria.  Musculoskeletal: Negative for myalgias, neck pain and neck stiffness.  Neurological: Negative for dizziness, syncope, facial asymmetry, speech  difficulty, weakness, light-headedness, numbness and headaches.     Physical Exam Triage Vital Signs ED Triage Vitals [03/01/21 1913]  Enc Vitals Group     BP (!) 152/82     Pulse Rate 80     Resp 18     Temp 98.5 F (36.9 C)     Temp Source Oral     SpO2 98 %     Weight      Height      Head Circumference      Peak Flow      Pain Score 0     Pain Loc      Pain Edu?      Excl. in GC?    No data found.  Updated Vital Signs BP (!) 152/82 (BP Location: Left Arm)   Pulse 80   Temp  98.5 F (36.9 C) (Oral)   Resp 18   LMP 02/24/2021   SpO2 98%   Visual Acuity Right Eye Distance:   Left Eye Distance:   Bilateral Distance:    Right Eye Near:   Left Eye Near:    Bilateral Near:     Physical Exam Vitals and nursing note reviewed.  Constitutional:      Appearance: She is well-developed.     Comments: No acute distress  HENT:     Head: Normocephalic and atraumatic.     Nose: Nose normal.  Eyes:     Conjunctiva/sclera: Conjunctivae normal.  Cardiovascular:     Rate and Rhythm: Normal rate and regular rhythm.  Pulmonary:     Effort: Pulmonary effort is normal. No respiratory distress.     Comments: Breathing comfortably at rest, CTABL, no wheezing, rales or other adventitious sounds auscultated Abdominal:     General: There is no distension.  Musculoskeletal:        General: Normal range of motion.     Cervical back: Neck supple.  Skin:    General: Skin is warm and dry.  Neurological:     Mental Status: She is alert and oriented to person, place, and time.      UC Treatments / Results  Labs (all labs ordered are listed, but only abnormal results are displayed) Labs Reviewed - No data to display  EKG   Radiology No results found.  Procedures Procedures (including critical care time)  Medications Ordered in UC Medications - No data to display  Initial Impression / Assessment and Plan / UC Course  I have reviewed the triage vital signs and the nursing notes.  Pertinent labs & imaging results that were available during my care of the patient were reviewed by me and considered in my medical decision making (see chart for details).     EKG normal sinus rhythm with sinus arrhythmia, no acute signs of ischemia or infarction, no abnormal beats auscultated or noted on EKG, patient declined symptoms well EKG was obtained.  Symptoms most suggestive of palpitation, possible underlying PVCs.  Otherwise patient stable, exam reassuring, vital signs  unremarkable.  Recommending monitoring for continued resolution, discussed follow-up with cardiology if persistent.  Discussed strict return precautions. Patient verbalized understanding and is agreeable with plan.  Final Clinical Impressions(s) / UC Diagnoses   Final diagnoses:  Palpitations     Discharge Instructions     Please follow up with cardiology if symptoms persistent/recurrent Read attached on palpitations   ED Prescriptions    None     PDMP not reviewed this encounter.   Kentarius Partington, Jackson C, PA-C  03/02/21 0753  

## 2021-03-07 ENCOUNTER — Encounter: Payer: Self-pay | Admitting: Family Medicine

## 2021-03-07 ENCOUNTER — Ambulatory Visit (INDEPENDENT_AMBULATORY_CARE_PROVIDER_SITE_OTHER): Payer: BC Managed Care – PPO | Admitting: Family Medicine

## 2021-03-07 ENCOUNTER — Other Ambulatory Visit: Payer: Self-pay

## 2021-03-07 VITALS — BP 120/70 | HR 58 | Resp 12 | Ht 68.25 in | Wt 149.0 lb

## 2021-03-07 DIAGNOSIS — G894 Chronic pain syndrome: Secondary | ICD-10-CM

## 2021-03-07 DIAGNOSIS — M549 Dorsalgia, unspecified: Secondary | ICD-10-CM

## 2021-03-07 DIAGNOSIS — M62838 Other muscle spasm: Secondary | ICD-10-CM | POA: Diagnosis not present

## 2021-03-07 DIAGNOSIS — R7989 Other specified abnormal findings of blood chemistry: Secondary | ICD-10-CM

## 2021-03-07 DIAGNOSIS — R002 Palpitations: Secondary | ICD-10-CM | POA: Diagnosis not present

## 2021-03-07 DIAGNOSIS — E538 Deficiency of other specified B group vitamins: Secondary | ICD-10-CM

## 2021-03-07 NOTE — Patient Instructions (Addendum)
A few things to remember from today's visit:  Chronic pain disorder  Upper back pain on left side  Heart palpitations  Muscle spasm  If you need refills please call your pharmacy. Do not use My Chart to request refills or for acute issues that need immediate attention.   Keep appt with cardio and pain management. Continue following with sport medicine. Try to monitor blood pressure at home.  Please be sure medication list is accurate. If a new problem present, please set up appointment sooner than planned today.

## 2021-03-07 NOTE — Progress Notes (Signed)
Chief Complaint  Patient presents with  . lump at base of spine   HPI: Mary Bright is a 25 y.o. female with Hx of chronic pain,asthma, and anxiety here today with above complaint. She felt a prominent area around scarum on 02/24/21 around 7 pm, T shape. No erythema or skin rash/lesion on area. No hx of trauma. Resolved next day. She did not try OTC medications.  Hx of upper and lower back pain. Follows with sport medicine. She is on Lyrica 75 mg 2 caps bid. Pain management appt in 03/2021.  HLA-B27 in 02/2021 negative.  She thinks she may have Ehlers-Danlos synd and this could be causing back pain and arthralgias.  She goggle disease and she has some of the symptoms. "Clicking joints, dizziness,and  heart problem." No hx of cardiac disease or heart murmur.  She was in the ER on 03/01/21 because palpitations. This is a new problem. She has had problem at night and at rest. Right after one episode of palpitation she felt like she had "to catch" her breath "a little." Episodes last a couple seconds. No associated CP or diaphoresis. She tries to avoid caffeine.  BP was elevated at 152/82. No hx of HTN. EKG on 03/01/21: Sinus arrhythmia,normal axis and intervals. She has appt with cardiologist in 04/2021.  She has also have abnormal TSH. Negative for changes in bowel habits, cold/heat intolerance,tremor,or abnormal wt loss.  Lab Results  Component Value Date   TSH 4.52 (H) 12/19/2020   B12 deficiency: She is not on B12 supplementation. B12 injections were recommended.  Lab Results  Component Value Date   VITAMINB12 279 12/19/2020   Review of Systems  Constitutional: Positive for fatigue. Negative for activity change, appetite change and fever.  HENT: Negative for mouth sores, nosebleeds and sore throat.   Respiratory: Negative for cough and wheezing.   Gastrointestinal: Negative for abdominal pain, nausea and vomiting.  Genitourinary: Negative for decreased urine  volume and hematuria.  Neurological: Negative for syncope and weakness.  Psychiatric/Behavioral: Negative for confusion. The patient is nervous/anxious.   Rest see pertinent positives and negatives per HPI.  Current Outpatient Medications on File Prior to Visit  Medication Sig Dispense Refill  . albuterol (VENTOLIN HFA) 108 (90 Base) MCG/ACT inhaler Inhale 2 puffs into the lungs every 4 (four) hours as needed for wheezing or shortness of breath. 8 g 0  . etonogestrel-ethinyl estradiol (NUVARING) 0.12-0.015 MG/24HR vaginal ring Insert vaginally and leave in place for 3 consecutive weeks, then remove for 1 week. 4 each 3  . pregabalin (LYRICA) 75 MG capsule Take 2 capsules (150 mg total) by mouth 2 (two) times daily as needed (nerve pain). 120 capsule 3   No current facility-administered medications on file prior to visit.   Past Medical History:  Diagnosis Date  . Anxiety   . Asthma   . Depression    History of psych admission at age 36   No Known Allergies  Social History   Socioeconomic History  . Marital status: Single    Spouse name: Not on file  . Number of children: Not on file  . Years of education: Not on file  . Highest education level: Not on file  Occupational History  . Not on file  Tobacco Use  . Smoking status: Never Smoker  . Smokeless tobacco: Never Used  Vaping Use  . Vaping Use: Never used  Substance and Sexual Activity  . Alcohol use: Yes    Comment: socially  .  Drug use: No  . Sexual activity: Not on file  Other Topics Concern  . Not on file  Social History Narrative   Student at [employer] activities - drama / theatre club   Social Determinants of Health   Financial Resource Strain: Not on file  Food Insecurity: Not on file  Transportation Needs: Not on file  Physical Activity: Not on file  Stress: Not on file  Social Connections: Not on file   Vitals:   03/07/21 1457  BP: 120/70  Pulse: (!) 58   Resp: 12  SpO2: 98%   Body mass index is 22.49 kg/m.  Physical Exam Vitals and nursing note reviewed.  Constitutional:      General: She is not in acute distress.    Appearance: She is well-developed and normal weight.  HENT:     Head: Normocephalic and atraumatic.  Eyes:     Conjunctiva/sclera: Conjunctivae normal.  Neck:     Thyroid: No thyroid mass.  Cardiovascular:     Rate and Rhythm: Regular rhythm. Bradycardia present.     Heart sounds: No murmur heard.   Pulmonary:     Effort: Pulmonary effort is normal. No respiratory distress.     Breath sounds: Normal breath sounds.  Abdominal:     Palpations: Abdomen is soft. There is no hepatomegaly or mass.     Tenderness: There is no abdominal tenderness.  Musculoskeletal:     Cervical back: Spasms and tenderness present. No bony tenderness.     Lumbar back: No tenderness or bony tenderness. Negative right straight leg raise test and negative left straight leg raise test.       Back:  Lymphadenopathy:     Cervical: No cervical adenopathy.  Skin:    General: Skin is warm.     Findings: No erythema or rash.  Neurological:     Mental Status: She is alert and oriented to person, place, and time.     Cranial Nerves: No cranial nerve deficit.     Gait: Gait normal.  Psychiatric:        Mood and Affect: Mood is anxious.     Comments: Well groomed, good eye contact.    ASSESSMENT AND PLAN:  Mary Bright was seen today for lump at base of spine.  Diagnoses and all orders for this visit: Orders Placed This Encounter  Procedures  . T3, free  . T4, free  . TSH    Muscle spasm Lower back, resolved. Monitor for recurrence.  Chronic pain disorder She has an appt with pain management/phy med and rehab, Dr Read Drivers. I think it is appropriate to discuss her concerns about Ehlers-Danlos during her visit with either med and rehab medicine or sport medicine.  Upper back pain on left side Continue Lyrica 75 mg 2 caps  bid. Following with Sport Medicine.  Heart palpitations Hx and auscultation today do not suggest a serious problem. She has appt with cardiologist.  B12 deficiency B12 injections were recommended, not sure about insurance coverage. B12 1000 mcg sublingual will be sent to her pharmacy.  Abnormal TSH Mild. I do not have lab service at this time, lab appt will be arranged. Further recommendations according to thyroid panel results.  Return if symptoms worsen or fail to improve.   Matisyn Cabeza G. [place], MD  [facility]. [place] office.  A few things to remember from today's visit:  Chronic pain disorder  Upper back pain on left side  Heart palpitations  Muscle spasm  If you need refills please call your pharmacy. Do not use My Chart to request refills or for acute issues that need immediate attention.   Keep appt with cardio and pain management. Continue following with sport medicine. Try to monitor blood pressure at home.  Please be sure medication list is accurate. If a new problem present, please set up appointment sooner than planned today.

## 2021-03-13 ENCOUNTER — Encounter: Payer: Self-pay | Admitting: Family Medicine

## 2021-03-13 MED ORDER — CYANOCOBALAMIN 1000 MCG SL SUBL: SUBLINGUAL_TABLET | SUBLINGUAL | 0 refills | Status: DC

## 2021-03-20 ENCOUNTER — Other Ambulatory Visit: Payer: BC Managed Care – PPO

## 2021-03-21 ENCOUNTER — Other Ambulatory Visit: Payer: Self-pay

## 2021-03-21 ENCOUNTER — Other Ambulatory Visit (INDEPENDENT_AMBULATORY_CARE_PROVIDER_SITE_OTHER): Payer: BC Managed Care – PPO

## 2021-03-21 DIAGNOSIS — R7989 Other specified abnormal findings of blood chemistry: Secondary | ICD-10-CM

## 2021-03-21 LAB — T4, FREE: Free T4: 0.85 ng/dL (ref 0.60–1.60)

## 2021-03-21 LAB — TSH: TSH: 1.17 u[IU]/mL (ref 0.35–4.50)

## 2021-03-21 LAB — T3, FREE: T3, Free: 4.6 pg/mL — ABNORMAL HIGH (ref 2.3–4.2)

## 2021-03-23 ENCOUNTER — Encounter: Payer: Self-pay | Admitting: Physical Medicine & Rehabilitation

## 2021-03-23 ENCOUNTER — Encounter
Payer: BC Managed Care – PPO | Attending: Physical Medicine & Rehabilitation | Admitting: Physical Medicine & Rehabilitation

## 2021-03-23 ENCOUNTER — Other Ambulatory Visit: Payer: Self-pay

## 2021-03-23 VITALS — BP 115/69 | HR 73 | Temp 98.5°F | Ht 68.0 in | Wt 150.0 lb

## 2021-03-23 DIAGNOSIS — M797 Fibromyalgia: Secondary | ICD-10-CM | POA: Diagnosis not present

## 2021-03-23 NOTE — Progress Notes (Signed)
Subjective:    Patient ID: Mary Bright, female    DOB: 1996-07-20, 25 y.o.   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 General activity 7 Relation with others 8 Enjoyment of life 10 What TIME of day is your pain at its worst? morning  and evening Sleep (in general) Fair  Pain is worse with: walking, bending, sitting, inactivity, and standing Pain improves with: heat/ice and medication Relief from Meds: 2  walk without assistance how many minutes can you walk? Not sure ability to climb steps?  yes do you drive?  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 I need assistance with the following:  meal prep, household duties, and shopping  weakness numbness tingling trouble walking spasms dizziness depression anxiety suicidal thoughts  x-rays  N/a    Family History  Problem Relation Age of Onset   Depression Mother    Bipolar disorder Maternal Aunt    Bipolar disorder Maternal Grandfather    Alcohol abuse Paternal Grandfather    Healthy Father    Social History   Socioeconomic History   Marital status: Single    Spouse name: Not on file   Number of children: Not on file   Years of education: Not on file   Highest education level: Not on file  Occupational History   Not on file   Tobacco Use   Smoking status: Never   Smokeless tobacco: Never  Vaping Use   Vaping Use: Never used  Substance and Sexual Activity   Alcohol use: Yes    Comment: socially   Drug use: No   Sexual activity: Not on file  Other Topics Concern   Not on file  Social History Narrative   Student at [employer]   Good grades   Extracurricular activities - drama / theatre club   Social Determinants of Health   Financial Resource Strain: Not on file  Food Insecurity: Not on file  Transportation Needs: Not on file  Physical Activity: Not on file  Stress: Not on file  Social Connections: Not on file   Past Surgical History:  Procedure Laterality Date   HAND SURGERY Right 2015   Past Medical History:  Diagnosis Date   Anxiety    Asthma    Depression    History of psych admission at age 17   BP 115/69 (BP Location: Right Arm, Patient Position: Sitting)   Pulse 73   Temp 98.5 F (36.9 C)   Ht 5\' 8"  (1.727 m)   Wt 150 lb (68 kg)   LMP 02/24/2021   SpO2 98%   BMI 22.81 kg/m   Opioid Risk Score:   Fall Risk Score:  `1  Depression screen PHQ 2/9  No  flowsheet data found.     Review of Systems  Constitutional:  Positive for chills and fever.  HENT: Negative.    Eyes: Negative.   Respiratory:  Positive for cough, shortness of breath and wheezing.   Cardiovascular: Negative.   Gastrointestinal:  Positive for abdominal pain, constipation and nausea.       Poor apetite  Endocrine: Negative.   Genitourinary: Negative.   Musculoskeletal:  Positive for back pain and neck pain.       Hip pain  Skin: Negative.   Allergic/Immunologic: Negative.   Neurological:  Positive for dizziness and numbness.  Hematological: Negative.   Psychiatric/Behavioral:  Positive for dysphoric mood and suicidal ideas. The patient is nervous/anxious.       Objective:   Physical Exam Vitals and nursing note reviewed.  Constitutional:      Appearance: She is normal weight.  HENT:      Head: Normocephalic and atraumatic.  Eyes:     Extraocular Movements: Extraocular movements intact.     Conjunctiva/sclera: Conjunctivae normal.     Pupils: Pupils are equal, round, and reactive to light.  Cardiovascular:     Rate and Rhythm: Normal rate and regular rhythm.     Heart sounds: Normal heart sounds.  Pulmonary:     Effort: Pulmonary effort is normal. No respiratory distress.     Breath sounds: Normal breath sounds.  Abdominal:     General: Bowel sounds are normal. There is no distension.     Palpations: Abdomen is soft.  Musculoskeletal:        General: No swelling, tenderness or deformity. Normal range of motion.     Comments: Normal range of motion cervical spine lumbar spine thoracic spine shoulders elbows knees hips and ankles She does have some hypermobility at the wrist able to bring the thumb down to the volar forearm No evidence of joint deformities no evidence of synovitis.  Skin:    General: Skin is warm and dry.     Findings: No bruising, lesion or rash.  Neurological:     Mental Status: She is alert and oriented to person, place, and time.     Cranial Nerves: No facial asymmetry.     Motor: No weakness, tremor, atrophy or abnormal muscle tone.     Coordination: Coordination is intact.     Gait: Gait is intact.     Deep Tendon Reflexes:     Reflex Scores:      Tricep reflexes are 2+ on the right side and 2+ on the left side.      Bicep reflexes are 2+ on the right side and 2+ on the left side.      Brachioradialis reflexes are 2+ on the right side and 2+ on the left side.      Patellar reflexes are 2+ on the right side and 2+ on the left side.      Achilles reflexes are 2+ on the right side and 2+ on the left side.    Comments: Motor strength is 5/5 bilateral deltoid, bicep, tricep, grip, hip flexor, knee extensor, ankle dorsiflexor and plantar flexor  Psychiatric:        Mood and Affect: Mood normal.        Behavior: Behavior normal.   Sacroiliac  provocative testing FABER's: Negative Distraction (supine): Negative Thigh thrust test: Negative      Assessment & Plan:  1.  Widespread body pain mainly upper chest neck upper mid and low back as well as lateral  hips.  The patient has had no trauma.  She has gradual onset of symptoms. She has comorbidities of anxiety depression and possibly irritable bowel.  Findings most consistent with fibromyalgia syndrome or at least aplastic pain syndrome. She gets some partial relief with Lyrica 150 mg twice daily would recommend increasing to 225 mg twice daily Aquatic PT at [facility] may be a good option for this patient [employer] [facility] [employer] [facility] has a comprehensive self-management program. Do not think any interventional pain techniques are needed in this situation Given prior history would recommend patient reconnect with mental health professional Patient has some joint laxity, she is concerned about Ehlers-Danlos syndrome, I discussed that I do not evaluate patients for this and she may need referral to rheumatology if she wishes to pursue this work up

## 2021-03-23 NOTE — Patient Instructions (Signed)
[employer]

## 2021-03-27 ENCOUNTER — Other Ambulatory Visit: Payer: Self-pay

## 2021-03-27 DIAGNOSIS — R7989 Other specified abnormal findings of blood chemistry: Secondary | ICD-10-CM

## 2021-04-04 ENCOUNTER — Other Ambulatory Visit: Payer: Self-pay

## 2021-04-04 ENCOUNTER — Ambulatory Visit (INDEPENDENT_AMBULATORY_CARE_PROVIDER_SITE_OTHER): Payer: BC Managed Care – PPO | Admitting: Family Medicine

## 2021-04-04 VITALS — BP 118/72 | HR 68 | Ht 68.0 in | Wt 155.8 lb

## 2021-04-04 DIAGNOSIS — Q796 Ehlers-Danlos syndrome, unspecified: Secondary | ICD-10-CM | POA: Diagnosis not present

## 2021-04-04 DIAGNOSIS — M797 Fibromyalgia: Secondary | ICD-10-CM

## 2021-04-04 DIAGNOSIS — R002 Palpitations: Secondary | ICD-10-CM | POA: Diagnosis not present

## 2021-04-04 MED ORDER — PREGABALIN 225 MG PO CAPS: 225.0000 mg | ORAL_CAPSULE | Freq: Two times a day (BID) | ORAL | 2 refills | Status: DC

## 2021-04-04 NOTE — Patient Instructions (Addendum)
Thank you for coming in today.   I think it is possible that you have EDS.   I think is very likely that you have Fibromyalgia.   Plan to increase lyrica to 225.   Your homework assignment is to find a therapist or LCSW who does CBT for chronic pain.    [facility] (PA[facility]) with Dr. Jeri Cos [facility] ward

## 2021-04-04 NOTE — Progress Notes (Signed)
I, Mary Bright, LAT, ATC, am serving as scribe for Dr. Lynne Bright.  Mary Bright is a 25 y.o. female who presents to [place] [facility] today for discussion of fibromyalgia and possible Ehlers-Danlos syndrome.  She was last seen by Dr. Georgina Bright on 02/14/21 for f/u of chronic LBP w/ worsening pain following left L3-4 and L5-S1 medial branch block on 02/08/21.  She was prescribed Lyrica and referred to pain management. Today, pt reports has been feeling about the same, having worse pain some days. Pt was seen by Dr. Letta Bright on 03/23/21, but didn't get any definitive dx or rx medication.  He suspected Erler's Danlos and fibromyalgia and recommended increasing Lyrica to 25 twice daily and referral to rheumatology for Erler's Danlos diagnosis.  Did not recommend further interventional injections for axial back pain.  Diagnostic testing: L-spine MRI- 01/07/21; L-spine XR- 12/19/20  Pertinent review of systems: No fevers or chills  Relevant historical information: No known family history for Ehlers-Danlos however Mary Bright suspects that her mother probably does have it but has never been diagnosed formally.   Exam:  BP 118/72   Pulse 68   Ht _0  (1.727 m)   Wt 155 lb 12.8 oz (70.7 kg)   SpO2 99%   BMI 23.69 kg/m  General: Well Developed, well nourished, and in no acute distress.   MSK: Normal lumbar spine motion.  Diagnostic criteria scorecard for an EDS Criterion 1: Beighton score 6/9  Criterion 2  A: (Needs 5) Positive for mild skin hypermobility. Positive for dental crowding. Positive for arachnodacctyly Negative for arm span to height ratio greater than 1.05 Unclear regarding diagnosis for mitral valve prolapse based on echocardiographic criteria or aortic root dilation  B: Negative family history  C: Positive for musculoskeletal pain,  Positive for chronic widespread pain Negative for recurrent joint dislocation  Criterion 3: Criteria met absence of  alternate diagnosis      Assessment and Plan: 25 y.o. female with chronic pain Multifactorial.  Patient very likely has fibromyalgia.  She has been treated as thus with Lyrica which has had some benefit.  Per pain management recommendation we will increase Lyrica to 225 twice daily.  Treat holistically with continued activities as tolerated and recommend calling behavioral therapy for management of chronic pain.  Patient will seek out and do some research and find a cognitive therapist she would like to see and let me know who they are and I will place referral.  Also gave her homework assignment to do some research and reading about basics of chronic pain management and concepts of CBT for this.  As for the possibility of Erler's Danlos.  I think it is very likely that [name] does have an EDS.  She almost meets the criteria at this point and if she has either mitral valve prolapse or aortic root dilation with a Z score of greater than +2 she would meet EDS based on 5 of the criteria 2 A features being present. Given EDS does have a high risk for a ordered dilation we will go ahead and proceed with echocardiogram now.  She does have palpitations and does have an upcoming appoint with cardiology in mid July.  Echocardiogram would probably be helpful regardless.  Recheck with me in 3 months.   PDMP not reviewed this encounter. Orders Placed This Encounter  Procedures   ECHOCARDIOGRAM COMPLETE    Standing Status:   Future    Standing Expiration Date:   04/04/2022  Order Specific Question:   Where should this test be performed    Answer:   [facility])    Order Specific Question:   Does the patient weigh less than or greater than 250 lbs?    Answer:   Patient weighs less than 250 lbs    Order Specific Question:   Perflutren DEFINITY (image enhancing agent) should be administered unless hypersensitivity or allergy exist    Answer:   Administer Perflutren    Order  Specific Question:   Reason for exam-Echo    Answer:   Other abnormalities of the heart R00.8   Meds ordered this encounter  Medications   pregabalin (LYRICA) 225 MG capsule    Sig: Take 1 capsule (225 mg total) by mouth 2 (two) times daily.    Dispense:  60 capsule    Refill:  2     Discussed warning signs or symptoms. Please see discharge instructions. Patient expresses understanding.   Total encounter time 40 minutes including face-to-face time with the patient and, reviewing past medical record, and charting on the date of service.   Discussion treatment plan and options.

## 2021-04-05 DIAGNOSIS — Q796 Ehlers-Danlos syndrome, unspecified: Secondary | ICD-10-CM | POA: Insufficient documentation

## 2021-04-05 DIAGNOSIS — M797 Fibromyalgia: Secondary | ICD-10-CM | POA: Insufficient documentation

## 2021-04-05 MED ORDER — PREGABALIN 225 MG PO CAPS: 225.0000 mg | ORAL_CAPSULE | Freq: Two times a day (BID) | ORAL | 2 refills | Status: DC

## 2021-04-05 NOTE — Addendum Note (Signed)
Addended by: Rodolph Bong on: 04/05/2021 07:44 AM   Modules accepted: Orders

## 2021-04-11 ENCOUNTER — Encounter: Payer: Self-pay | Admitting: Family Medicine

## 2021-04-11 NOTE — Addendum Note (Signed)
Addended by: Evon Slack on: 04/11/2021 07:54 AM   Modules accepted: Orders

## 2021-04-12 NOTE — Telephone Encounter (Signed)
Yes they have received the order I had to do a pre cert that I was unaware of

## 2021-04-27 ENCOUNTER — Encounter: Payer: Self-pay | Admitting: Cardiovascular Disease

## 2021-04-27 ENCOUNTER — Ambulatory Visit (INDEPENDENT_AMBULATORY_CARE_PROVIDER_SITE_OTHER): Payer: BC Managed Care – PPO | Admitting: Cardiovascular Disease

## 2021-04-27 ENCOUNTER — Other Ambulatory Visit: Payer: Self-pay

## 2021-04-27 VITALS — BP 124/72 | HR 61 | Ht 67.0 in | Wt 156.6 lb

## 2021-04-27 DIAGNOSIS — R002 Palpitations: Secondary | ICD-10-CM | POA: Diagnosis not present

## 2021-04-27 NOTE — Progress Notes (Signed)
Cardiology Office Note:    Date:  04/27/2021   ID:  Mary Bright, DOB 01-Jan-1996, MRN [id number]  PCP:  Martinique, Betty G, MD   [facility] Providers Cardiologist:  None     Referring MD: Martinique, Betty G, MD   Chief Complaint  Patient presents with   Palpitations   Suspicion for Ehlers-Danlos syndrome    History of Present Illness:    Mary Bright is a 25 y.o. female with a hx of anxiety and depression, but no serious medical illness who has had some problems with hyperextensible joints and musculoskeletal pain, raising suspicion for possible Ehlers-Danlos syndrome.  She has never actually had joint dislocation or required surgical interventions.  She does have some degree of hyperextensible skin.  She does have a hyperextensible thumb.  She is scheduled for an echocardiogram next week.  She has had infrequent palpitations but these did lead to evaluation in urgent care a few months ago.  The EKG was normal.  The palpitations occur sporadically and unpredictably and are generally nonsustained (although they can occur off and on for as much as 24 hours.  There is no known history in her family for confirmed Ehlers-Danlos syndrome or Marfan syndrome or other connective tissue abnormalities.  Mary Bright reports that her maternal great aunt (her grandmother's twin) had a lot of problems with her joints, as well as one of her maternal aunts.  Diagnostic criteria scorecard for an EDS Criterion 1: Beighton score 6/9   Criterion 2 A: (Needs 5) Positive for mild skin hypermobility. Positive for dental crowding. Positive for arachnodacctyly Negative for arm span to height ratio greater than 1.05 Unclear regarding diagnosis for mitral valve prolapse based on echocardiographic criteria or aortic root dilation   B: Negative family history   C: Positive for musculoskeletal pain, Positive for chronic widespread pain Negative for recurrent joint dislocation   Criterion 3: Criteria met  absence of alternate diagnosis    Past Medical History:  Diagnosis Date   Anxiety    Asthma    Depression    History of psych admission at age 89    Past Surgical History:  Procedure Laterality Date   HAND SURGERY Right 2015    Current Medications: Current Meds  Medication Sig   albuterol (VENTOLIN HFA) 108 (90 Base) MCG/ACT inhaler Inhale 2 puffs into the lungs every 4 (four) hours as needed for wheezing or shortness of breath.   etonogestrel-ethinyl estradiol (NUVARING) 0.12-0.015 MG/24HR vaginal ring Insert vaginally and leave in place for 3 consecutive weeks, then remove for 1 week.   pregabalin (LYRICA) 225 MG capsule Take 1 capsule (225 mg total) by mouth 2 (two) times daily.     Allergies:   Patient has no known allergies.   Social History   Socioeconomic History   Marital status: Single    Spouse name: Not on file   Number of children: Not on file   Years of education: Not on file   Highest education level: Not on file  Occupational History   Not on file  Tobacco Use   Smoking status: Never   Smokeless tobacco: Never  Vaping Use   Vaping Use: Never used  Substance and Sexual Activity   Alcohol use: Yes    Comment: socially   Drug use: No   Sexual activity: Not on file  Other Topics Concern   Not on file  Social History Narrative   Student at [employer]  activities - drama / theatre club   Social Determinants of Health   Financial Resource Strain: Not on file  Food Insecurity: Not on file  Transportation Needs: Not on file  Physical Activity: Not on file  Stress: Not on file  Social Connections: Not on file     Family History: The patient's family history includes Alcohol abuse in her paternal grandfather; Bipolar disorder in her maternal aunt and maternal grandfather; Depression in her mother; Healthy in her father.  ROS:   Please see the history of present illness.     All other systems reviewed and  are negative.  EKGs/Labs/Other Studies Reviewed:    The following studies were reviewed today: Notes from sports medicine  EKG:  EKG is  ordered today.  The ekg ordered today demonstrates sinus rhythm with prominent sinus arrhythmia views, a completely normal tracing.  Because of the variable heart rates the QTC was calculated anywhere from 430 6459 ms, still in normal range.  Recent Labs: 12/19/2020: BUN 11; Creatinine, Ser 0.57; Hemoglobin 13.2; Platelets 258.0; Potassium 3.7; Sodium 139 03/21/2021: TSH 1.17  Recent Lipid Panel    Component Value Date/Time   CHOL 130 12/15/2011 1956   TRIG 81 12/15/2011 1956   HDL 33 (L) 12/15/2011 1956   CHOLHDL 3.9 12/15/2011 1956   VLDL 16 12/15/2011 1956   LDLCALC 81 12/15/2011 1956     Risk Assessment/Calculations:           Physical Exam:    VS:  BP 124/72   Pulse 61   Ht _0  (1.702 m)   Wt 156 lb 9.6 oz (71 kg)   SpO2 99%   BMI 24.53 kg/m     Wt Readings from Last 3 Encounters:  04/27/21 156 lb 9.6 oz (71 kg)  04/04/21 155 lb 12.8 oz (70.7 kg)  03/23/21 150 lb (68 kg)     GEN:  Well nourished, well developed in no acute distress HEENT: Normal NECK: No JVD; No carotid bruits, right venous hum LYMPHATICS: No lymphadenopathy CARDIAC: RRR, no murmurs, rubs, gallops.  Cannot elicit a systolic click or murmur even with a Valsalva maneuver or standing RESPIRATORY:  Clear to auscultation without rales, wheezing or rhonchi  ABDOMEN: Soft, non-tender, non-distended MUSCULOSKELETAL:  No edema; No deformity  SKIN: Warm and dry NEUROLOGIC:  Alert and oriented x 3 PSYCHIATRIC:  Normal affect   ASSESSMENT:    1. Palpitations    PLAN:    In order of problems listed above:  Palpitations: Infrequent and unpredictable.  I doubt we will catch these with a conventional arrhythmia monitor.  I recommend that she invest in a smart watch or a cardia device and she can send me tracings through MyChart.  That she does have asthma and if  we decide to start beta-blocker therapy we should use a highly selective agent such as Bystolic Possible Ehlers-Danlos syndrome: By physical exam there is no evidence of cardiovascular involvement, but we will have to wait for her echocardiogram.  We will call her with the results.  If the echo is normal and the aortic root size is completely normal I would probably recommend follow-up in 3-5 years (since otherwise the diagnosis of Ehlers-Danlos remains doubtful).  If there are abnormalities on the echocardiogram or if the Ehlers-Danlos diagnosis is firmly made, may need a yearly echo follow-up.        Medication Adjustments/Labs and Tests Ordered: Current medicines are reviewed at length with the patient today.  Concerns regarding medicines  are outlined above.  Orders Placed This Encounter  Procedures   EKG 12-Lead   No orders of the defined types were placed in this encounter.   Patient Instructions  Medication Instructions:  No changes *If you need a refill on your cardiac medications before your next appointment, please call your pharmacy*   Lab Work: None ordered If you have labs (blood work) drawn today and your tests are completely normal, you will receive your results only by: [place] (if you have MyChart) OR A paper copy in the mail If you have any lab test that is abnormal or we need to change your treatment, we will call you to review the results.   Testing/Procedures: None ordered   Follow-Up: At [facility], you and your health needs are our priority.  As part of our continuing mission to provide you with exceptional heart care, we have created designated Provider Care Teams.  These Care Teams include your primary Cardiologist (physician) and Advanced Practice Providers (APPs -  Physician Assistants and Nurse Practitioners) who all work together to provide you with the care you need, when you need it.  We recommend signing up for the patient portal called  "MyChart".  Sign up information is provided on this After Visit Summary.  MyChart is used to connect with patients for Virtual Visits (Telemedicine).  Patients are able to view lab/test results, encounter notes, upcoming appointments, etc.  Non-urgent messages can be sent to your provider as well.   To learn more about what you can do with MyChart, go to [email or web address].    Your next appointment:   Follow up as needed with Dr. Sallyanne Kuster   Signed, Sanda Klein, MD  04/27/2021 9:42 AM    [place]

## 2021-04-27 NOTE — Patient Instructions (Signed)
Medication Instructions:  No changes *If you need a refill on your cardiac medications before your next appointment, please call your pharmacy*   Lab Work: None ordered If you have labs (blood work) drawn today and your tests are completely normal, you will receive your results only by: MyChart Message (if you have MyChart) OR A paper copy in the mail If you have any lab test that is abnormal or we need to change your treatment, we will call you to review the results.   Testing/Procedures: None ordered   Follow-Up: At [facility], you and your health needs are our priority.  As part of our continuing mission to provide you with exceptional heart care, we have created designated Provider Care Teams.  These Care Teams include your primary Cardiologist (physician) and Advanced Practice Providers (APPs -  Physician Assistants and Nurse Practitioners) who all work together to provide you with the care you need, when you need it.  We recommend signing up for the patient portal called "MyChart".  Sign up information is provided on this After Visit Summary.  MyChart is used to connect with patients for Virtual Visits (Telemedicine).  Patients are able to view lab/test results, encounter notes, upcoming appointments, etc.  Non-urgent messages can be sent to your provider as well.   To learn more about what you can do with MyChart, go to [email or web address].    Your next appointment:   Follow up as needed with Dr. Croitoru  

## 2021-05-01 ENCOUNTER — Other Ambulatory Visit (HOSPITAL_COMMUNITY): Payer: BC Managed Care – PPO

## 2021-05-01 ENCOUNTER — Encounter: Payer: Self-pay | Admitting: Family Medicine

## 2021-05-02 ENCOUNTER — Other Ambulatory Visit: Payer: Self-pay

## 2021-05-02 DIAGNOSIS — M542 Cervicalgia: Secondary | ICD-10-CM

## 2021-05-02 NOTE — Progress Notes (Signed)
PT referral place.

## 2021-05-02 NOTE — Telephone Encounter (Signed)
Try tapering the dose of Lyrica down.  If you have old pills reduce the dose down to 150 and 75 twice daily then stop.  Would you like me to place a referral to physical therapy again?

## 2021-05-08 ENCOUNTER — Ambulatory Visit (HOSPITAL_COMMUNITY): Payer: BC Managed Care – PPO | Attending: Internal Medicine

## 2021-05-08 ENCOUNTER — Other Ambulatory Visit: Payer: Self-pay

## 2021-05-08 DIAGNOSIS — R002 Palpitations: Secondary | ICD-10-CM | POA: Diagnosis not present

## 2021-05-08 DIAGNOSIS — Q796 Ehlers-Danlos syndrome, unspecified: Secondary | ICD-10-CM | POA: Insufficient documentation

## 2021-05-08 LAB — ECHOCARDIOGRAM COMPLETE
Area-P 1/2: 2.92 cm2
S' Lateral: 3 cm

## 2021-05-11 ENCOUNTER — Ambulatory Visit (HOSPITAL_BASED_OUTPATIENT_CLINIC_OR_DEPARTMENT_OTHER): Payer: BC Managed Care – PPO | Attending: Family Medicine | Admitting: Physical Therapy

## 2021-05-11 ENCOUNTER — Other Ambulatory Visit: Payer: Self-pay

## 2021-05-11 DIAGNOSIS — R252 Cramp and spasm: Secondary | ICD-10-CM | POA: Diagnosis not present

## 2021-05-11 DIAGNOSIS — M546 Pain in thoracic spine: Secondary | ICD-10-CM | POA: Diagnosis not present

## 2021-05-11 DIAGNOSIS — G8929 Other chronic pain: Secondary | ICD-10-CM | POA: Insufficient documentation

## 2021-05-11 DIAGNOSIS — M545 Low back pain, unspecified: Secondary | ICD-10-CM | POA: Insufficient documentation

## 2021-05-12 ENCOUNTER — Encounter (HOSPITAL_BASED_OUTPATIENT_CLINIC_OR_DEPARTMENT_OTHER): Payer: Self-pay | Admitting: Physical Therapy

## 2021-05-12 NOTE — Therapy (Signed)
[facility] [address] [place], [place], [address] Phone: [phone]   Fax:  [phone]  Physical Therapy Evaluation  Patient Details  Name: Mary Bright MRN: [id number] Date of Birth: May 08, 1996 Referring Provider (PT): Dr Clementeen Graham   Encounter Date: 05/11/2021   PT End of Session - 05/12/21 0904     Visit Number 1    Number of Visits 6    Date for PT Re-Evaluation 06/23/21    Authorization Type BCBS    PT Start Time 1400    PT Stop Time 1443    PT Time Calculation (min) 43 min    Activity Tolerance Patient tolerated treatment well    Behavior During Therapy [facility] for tasks assessed/performed             Past Medical History:  Diagnosis Date   Anxiety    Asthma    Depression    History of psych admission at age 69    Past Surgical History:  Procedure Laterality Date   HAND SURGERY Right 2015    There were no vitals filed for this visit.    Subjective Assessment - 05/11/21 1410     Subjective Patient is a 25 year old female with a long history of upper back and neck pain starting when she was in middle school. She recently recieved a suspected diagnosis of Elhers Dunlos syndryome. She had an episode of herat palpatiations a few months ago but nothing since. 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 She has neck and upper back pain when she carries the children. She tries to carry them on the right. Most of her pain is on the left.    Pertinent History , palpitations (nothing on ekg), fibromalgia, depression,    How long can you sit comfortably? 10-15 mins    How long can you stand comfortably? 5 mins    How long can you walk comfortably? not limited    Currently in Pain? Yes    Pain Score 6     Pain Location Back    Pain Orientation Left;Right;Mid   left   Pain Descriptors / Indicators Aching    Pain Type Chronic pain    Pain Radiating Towards buttocks, icy toes/numbness    Pain Onset  More than a month ago    Pain Frequency Constant    Aggravating Factors  carrying children, lying on her left side    Pain Relieving Factors rest    Effect of Pain on Daily Activities difficulty perfroming ADL's    Multiple Pain Sites Yes    Pain Score 7    Pain Location Neck    Pain Orientation Right;Left    Pain Descriptors / Indicators Aching    Pain Radiating Towards n/a    Pain Onset More than a month ago    Pain Frequency Constant    Aggravating Factors  carrying children; certain activity at times    Pain Relieving Factors rest    Effect of Pain on Daily Activities difficulty perfroming ADL's                [facility] PT Assessment - 05/12/21 0001       Assessment   Medical Diagnosis Thoracic/ Cervical pain    Referring Provider (PT) Dr Clementeen Graham    Onset Date/Surgical Date --   Some form of neck or upper back pain since middle school   Hand Dominance Right    Prior  Therapy december 2021 at [place]. No significant improvement in symptoms      Precautions   Precautions Back      Restrictions   Weight Bearing Restrictions No      Balance Screen   Has the patient fallen in the past 6 months No    Has the patient had a decrease in activity level because of a fear of falling?  No    Is the patient reluctant to leave their home because of a fear of falling?  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    Leisure grocery trips, carowinds,      Cognition   Overall Cognitive Status Within Functional Limits for tasks assessed    Attention Focused    Focused Attention Appears intact    Memory Appears intact    Awareness Appears intact    Problem Solving Appears intact      Sensation   Light Touch Appears Intact    Additional Comments tigling in her toes at times       Coordination   Gross Motor Movements are Fluid and Coordinated Yes    Fine Motor Movements are Fluid and Coordinated Yes      Posture/Postural Control   Posture Comments rounded shoulders, mild forward head      AROM   Overall AROM Comments general hyermobility in all joints; pain with end range left cervical rotation, but full ROM      PROM   Overall PROM Comments general hypermobility in all joints      Strength   Right Shoulder Flexion 4+/5    Right Shoulder Internal Rotation 5/5    Right Shoulder External Rotation 5/5    Left Shoulder Flexion 4+/5    Left Shoulder Internal Rotation 5/5    Left Shoulder External Rotation 5/5    Right Hip Flexion 4+/5    Right Hip ABduction 5/5    Right Hip ADduction 5/5    Left Hip Flexion 4+/5    Left Hip ABduction 5/5    Left Hip ADduction 5/5      Palpation   Palpation comment tenderness and tight to palpation of upper trap, cervical musculature and thoracic musculature.      Ambulation/Gait   Gait Comments noraml gait pattern                        Objective measurements completed on examination: See above findings.       [facility] Adult PT Treatment/Exercise - 05/12/21 0001       Shoulder Exercises: Standing   Other Standing Exercises scap retraction 2x10 red; shoulder extension 2x10 red    Other Standing Exercises bilateral ER red 2x10                    PT Education - 05/12/21 0903     Education Details HEP and symptom mangement    Person(s) Educated Patient    Methods Explanation;Demonstration;Tactile cues;Verbal cues    Comprehension Verbalized understanding;Returned demonstration;Verbal cues required;Tactile cues required              PT Short Term Goals - 05/12/21 1055       PT SHORT TERM GOAL #1  Title Patient will report a 50% reduction in pain during the day    Time 3    Period Weeks    Status New    Target Date 06/02/21      PT SHORT TERM GOAL #2   Title Patient will be  independnet with base strengthening program    Time 3    Period Weeks    Status New    Target Date 06/02/21      PT SHORT TERM GOAL #3   Title Patient will increase gorss UE and LE strength to 5/5    Time 3    Period Weeks    Status New    Target Date 06/02/21               PT Long Term Goals - 05/12/21 1056       PT LONG TERM GOAL #1   Title 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    Time 8    Period Weeks    Status New    Target Date 06/23/21      PT LONG TERM GOAL #3   Title Patient will work through a day without increased upper back or neck pain    Time 6    Period Weeks    Status New    Target Date 06/23/21                    Plan - 05/12/21 0904     Clinical Impression Statement Patient is a 25 year old female with neck and upper back pain L>R She has spasming into her neck and upper back. She reports these spasms have been there since she was in middle school. She has general hyper mobility in all joints. She has increased pain with work tasks. 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 She has mild strength deficits in shoulder and hip flexion. She is motivated to work on a strengthening and stretching plan that she can use long term to reduce pain. We will start her with an aquatic program and also have her see our [facility] specialist at [facility].    Personal Factors and Comorbidities Time since onset of injury/illness/exacerbation;Comorbidity 1;Comorbidity 2    Comorbidities Depression, Elhers Dunlos    Examination-Activity Limitations Locomotion Level;Reach Overhead;Squat;Stand;Lift;Carry    Examination-Participation Restrictions  Cleaning;Occupation;Community Activity    Stability/Clinical Decision Making Evolving/Moderate complexity    Clinical Decision Making Moderate    Rehab Potential Excellent    PT Frequency 1x / week    PT Duration 6 weeks    PT Treatment/Interventions ADLs/Self Care Home Management;Electrical Stimulation;DME Instruction;Iontophoresis 4mg /ml Dexamethasone;Moist Heat;Traction;Therapeutic activities;Functional mobility training;Therapeutic exercise;Neuromuscular re-education;Patient/family education;Manual techniques;Passive range of motion;Dry needling;Taping    PT Next Visit Plan begin awuatic rehab. consider posturel corection and strengthening. Can likley tolerate higher level pool strengtheing    PT Home Exercise Plan Access Code: [email or web address]  URL: [email or web address]  Date: 05/12/2021  Prepared by: 05/14/2021    Exercises  Theracane Over Shoulder - 1 x daily - 7 x weekly - 3 sets -  10 reps  Theracane Under Arm - 1 x daily - 7 x weekly - 3 sets - 10 reps  Standing Bilateral Low Shoulder Row with Anchored Resistance - 1 x daily - 7 x weekly - 3 sets - 10 reps  Shoulder extension with resistance - Neutral - 1 x daily - 7 x weekly - 3 sets - 10 reps  Shoulder External Rotation and Scapular Retraction with Resistance - 1 x daily - 7 x weekly - 3 sets - 10 reps  Seated Upper Trapezius Stretch - 1 x daily - 7 x weekly - 3 sets - 10 reps    Consulted and Agree with Plan of Care Patient             Patient will benefit from skilled therapeutic intervention in order to improve the following deficits and impairments:  Decreased range of motion, Increased fascial restricitons, Pain, Decreased mobility, Decreased strength, Postural dysfunction  Visit Diagnosis: Pain in thoracic spine  Chronic left-sided low back pain without sciatica  Cramp and spasm     Problem List Patient Active Problem List   Diagnosis Date Noted   Fibromyalgia 04/05/2021   EDS (Ehlers-Danlos syndrome)  Suspected 04/05/2021   GAD (generalized anxiety disorder) 07/07/2019   ADD (attention deficit disorder) 07/07/2019   Childhood asthma 03/31/2014   Enlarged lymph node 03/31/2014   Dysmenorrhea 03/31/2014   Depression 12/17/2011    Dessie Coma PT DPT  05/12/2021, 11:58 AM  Ermelinda Das  05/12/2021   During this treatment session, the therapist was present, participating in and directing the treatment.   [facility] [address] [place], [place], [address] Phone: [phone]   Fax:  [phone]  Name: Mary Bright MRN: [id number] Date of Birth: [birth year]

## 2021-05-12 NOTE — Patient Instructions (Signed)
Access Code: [email or web address] URL: [email or web address] Date: 05/12/2021 Prepared by: Lorayne Bender  Exercises Theracane Over Shoulder - 1 x daily - 7 x weekly - 3 sets - 10 reps Theracane Under Arm - 1 x daily - 7 x weekly - 3 sets - 10 reps Standing Bilateral Low Shoulder Row with Anchored Resistance - 1 x daily - 7 x weekly - 3 sets - 10 reps Shoulder extension with resistance - Neutral - 1 x daily - 7 x weekly - 3 sets - 10 reps Shoulder External Rotation and Scapular Retraction with Resistance - 1 x daily - 7 x weekly - 3 sets - 10 reps Seated Upper Trapezius Stretch - 1 x daily - 7 x weekly - 3 sets - 10 reps

## 2021-05-15 ENCOUNTER — Other Ambulatory Visit: Payer: Self-pay

## 2021-05-15 NOTE — Progress Notes (Signed)
Echocardiogram looks reassuringly normal.

## 2021-05-16 ENCOUNTER — Ambulatory Visit (INDEPENDENT_AMBULATORY_CARE_PROVIDER_SITE_OTHER): Payer: BC Managed Care – PPO | Admitting: Family Medicine

## 2021-05-16 ENCOUNTER — Encounter: Payer: Self-pay | Admitting: Family Medicine

## 2021-05-16 VITALS — BP 118/70 | HR 72 | Temp 98.1°F | Resp 12 | Ht 67.0 in | Wt 155.0 lb

## 2021-05-16 DIAGNOSIS — K6289 Other specified diseases of anus and rectum: Secondary | ICD-10-CM

## 2021-05-16 DIAGNOSIS — R200 Anesthesia of skin: Secondary | ICD-10-CM

## 2021-05-16 DIAGNOSIS — Z309 Encounter for contraceptive management, unspecified: Secondary | ICD-10-CM

## 2021-05-16 DIAGNOSIS — R202 Paresthesia of skin: Secondary | ICD-10-CM | POA: Diagnosis not present

## 2021-05-16 MED ORDER — DILTIAZEM GEL 2 %: 1.0000 "application " | Freq: Two times a day (BID) | CUTANEOUS | 1 refills | Status: DC

## 2021-05-16 NOTE — Progress Notes (Signed)
Chief Complaint  Patient presents with   Hemorrhoids    X a few years.   HPI: Mary Bright is a 25 y.o. female with hx of chronic back pain,fibromyalgia,and recently Dx'ed with EDS here today with above concerned. Having some rectal discomfort, she thinks it may be hemorrhoids.  Denies hx of hemorrhoids. She has had problem for a couple years and getting worse. She feels like something comes out and sometimes she has to "put them back in." It happens when she sits for long time and with defecation. +Dyschezia and rectal pruritus. No associated abdominal pain,nausea,or vomiting.  Occasionally she has constipation and has to strain. Has had blood on tissue before, not lately. She has used prep H.  Bowel movements q 1-2 days, last one this morning.  LMP 2 weeks ago.  "Weird" numbness and "bad itching "on both lower extremities. Lower back pain radiated to LLE. She notices it this morning but has been intermittent since she had lower "back injections", "a while ago." Negative for saddle anesthesia or bowel/bladder dysfunction.  She is on Lyrica 75 mg 2 caps bid. Evaluated by pain management and follows with Dr Mary Bright, sport medicine.  She also would like a referral to gyn, she wound like to discuss IUD. Currently she is on Novaring.  Review of Systems  Constitutional:  Positive for fatigue. Negative for activity change, appetite change and fever.  HENT:  Negative for mouth sores and sore throat.   Respiratory:  Negative for cough and shortness of breath.   Cardiovascular:  Negative for chest pain, palpitations and leg swelling.  Gastrointestinal:        Negative for changes in bowel habits.  Endocrine: Negative for cold intolerance and heat intolerance.  Genitourinary:  Negative for decreased urine volume, dysuria and hematuria.  Skin:  Negative for rash and wound.  Neurological:  Negative for seizures, syncope, weakness, numbness and headaches.  Rest see pertinent  positives and negatives per HPI.  Current Outpatient Medications on File Prior to Visit  Medication Sig Dispense Refill   albuterol (VENTOLIN HFA) 108 (90 Base) MCG/ACT inhaler Inhale 2 puffs into the lungs every 4 (four) hours as needed for wheezing or shortness of breath. 8 g 0   etonogestrel-ethinyl estradiol (NUVARING) 0.12-0.015 MG/24HR vaginal ring Insert vaginally and leave in place for 3 consecutive weeks, then remove for 1 week. 4 each 3   pregabalin (LYRICA) 75 MG capsule Take 2 capsules by mouth 2 (two) times daily.     No current facility-administered medications on file prior to visit.   Past Medical History:  Diagnosis Date   Anxiety    Asthma    Depression    History of psych admission at age 22   No Known Allergies  Social History   Socioeconomic History   Marital status: Single    Spouse name: Not on file   Number of children: Not on file   Years of education: Not on file   Highest education level: Not on file  Occupational History   Not on file  Tobacco Use   Smoking status: Never   Smokeless tobacco: Never  Vaping Use   Vaping Use: Never used  Substance and Sexual Activity   Alcohol use: Yes    Comment: socially   Drug use: No   Sexual activity: Not on file  Other Topics Concern   Not on file  Social History Narrative   Student at [employer]   Good grades  Extracurricular activities - drama / theatre club   Social Determinants of Health   Financial Resource Strain: Not on file  Food Insecurity: Not on file  Transportation Needs: Not on file  Physical Activity: Not on file  Stress: Not on file  Social Connections: Not on file   Vitals:   05/16/21 0852  BP: 118/70  Pulse: 72  Resp: 12  Temp: 98.1 F (36.7 C)  SpO2: 98%   Body mass index is 24.28 kg/m.  Physical Exam Vitals and nursing note reviewed.  Constitutional:      General: She is not in acute distress.    Appearance: She is well-developed.  HENT:     Head:  Normocephalic and atraumatic.  Eyes:     Conjunctiva/sclera: Conjunctivae normal.  Cardiovascular:     Pulses:          Posterior tibial pulses are 2+ on the right side and 2+ on the left side.  Pulmonary:     Effort: Pulmonary effort is normal. No respiratory distress.  Abdominal:     Palpations: Abdomen is soft. There is no mass.     Tenderness: There is no abdominal tenderness.  Genitourinary:    Rectum: No mass or external hemorrhoid. Normal anal tone.     Comments: On left side,knees to chest noted a small fissure,tender at 3 O'clock. No skin tags,rash/erythema, or other abnormalities on perianal inspection,  Musculoskeletal:     Thoracic back: No tenderness or bony tenderness.     Lumbar back: No tenderness or bony tenderness. Negative right straight leg raise test and negative left straight leg raise test.  Skin:    General: Skin is warm.     Findings: No erythema or rash.  Neurological:     General: No focal deficit present.     Mental Status: She is alert and oriented to person, place, and time.     Cranial Nerves: No cranial nerve deficit.     Gait: Gait normal.     Deep Tendon Reflexes:     Reflex Scores:      Patellar reflexes are 2+ on the right side and 2+ on the left side. Psychiatric:     Comments: Well groomed, good eye contact.   ASSESSMENT AND PLAN:  [name] was seen today for hemorrhoids.  Diagnoses and all orders for this visit:  Rectal pain We discussed possible etiologies. Small fissure noted on examination. I did not appreciate external hemorrhoids. Topical Diltiazem gel tid x 8 weeks recommended. Sitz bath daily prn may also help. Avoid constipation,straining,and prolonged toilet time. Increase fiber and fluid intake. If not any better ,GI evaluation can be arranged.  -     diltiazem 2 % GEL; Apply 1 application topically 2 (two) times daily.  Numbness and tingling of both lower extremities It seems to be chronic intermittent for years. ?  Radiculopathy, can also be associated with fibromyalgia. Lumbar MRI on 01/07/21 was normal.  I do not think imaging is needed today. Instructed about warning signs. Continue following with sport medicine.  Encounter for contraceptive management, unspecified type -     Ambulatory referral to Gynecology  Return if symptoms worsen or fail to improve.   Demarkis Gheen G. [place], MD  [facility]. [place] office.

## 2021-05-16 NOTE — Patient Instructions (Addendum)
A few things to remember from today's visit:  Rectal pain  Encounter for contraceptive management, unspecified type - Plan: Ambulatory referral to Gynecology  Numbness and tingling of both lower extremities  Rectal examination today otherwise negative for hemorrhoids. Question of small fissure. Diltiazem gel, small amount, 2 times daily for up to 8 weeks may help. Main treatment is avoiding straining,constipation,and prolonged toilet sitting. Constipation, Adult Constipation is when a person has trouble pooping (having a bowel movement). When you have this condition, you may poop fewer than 3 times a week. Your poop (stool) may also be dry, hard, or bigger than normal. Follow these instructions at home: Eating and drinking  Eat foods that have a lot of fiber, such as: Fresh fruits and vegetables. Whole grains. Beans. Eat less of foods that are low in fiber and high in fat and sugar, such as: [place] fries. Hamburgers. Cookies. Candy. Soda. Drink enough fluid to keep your pee (urine) pale yellow.  General instructions Exercise regularly or as told by your doctor. Try to do 150 minutes of exercise each week. Go to the restroom when you feel like you need to poop. Do not hold it in. Take over-the-counter and prescription medicines only as told by your doctor. These include any fiber supplements. When you poop: Do deep breathing while relaxing your lower belly (abdomen). Relax your pelvic floor. The pelvic floor is a group of muscles that support the rectum, bladder, and intestines (as well as the uterus in women). Watch your condition for any changes. Tell your doctor if you notice any. Keep all follow-up visits as told by your doctor. This is important. Contact a doctor if: You have pain that gets worse. You have a fever. You have not pooped for 4 days. You vomit. You are not hungry. You lose weight. You are bleeding from the opening of the butt (anus). You have thin,  pencil-like poop. Get help right away if: You have a fever, and your symptoms suddenly get worse. You leak poop or have blood in your poop. Your belly feels hard or bigger than normal (bloated). You have very bad belly pain. You feel dizzy or you faint. Summary Constipation is when a person poops fewer than 3 times a week, has trouble pooping, or has poop that is dry, hard, or bigger than normal. Eat foods that have a lot of fiber. Drink enough fluid to keep your pee (urine) pale yellow. Take over-the-counter and prescription medicines only as told by your doctor. These include any fiber supplements. This information is not intended to replace advice given to you by your health care provider. Make sure you discuss any questions you have with your healthcare provider. Document Revised: 08/19/2019 Document Reviewed: 08/19/2019 Elsevier Patient Education  2022 Elsevier Inc.   Do not use My Chart to request refills or for acute issues that need immediate attention.    Please be sure medication list is accurate. If a new problem present, please set up appointment sooner than planned today.

## 2021-05-18 ENCOUNTER — Encounter (HOSPITAL_BASED_OUTPATIENT_CLINIC_OR_DEPARTMENT_OTHER): Payer: Self-pay | Admitting: Physical Therapy

## 2021-05-18 ENCOUNTER — Ambulatory Visit (HOSPITAL_BASED_OUTPATIENT_CLINIC_OR_DEPARTMENT_OTHER): Payer: BC Managed Care – PPO | Attending: Family Medicine | Admitting: Physical Therapy

## 2021-05-18 ENCOUNTER — Other Ambulatory Visit: Payer: Self-pay

## 2021-05-18 DIAGNOSIS — G8929 Other chronic pain: Secondary | ICD-10-CM | POA: Diagnosis not present

## 2021-05-18 DIAGNOSIS — R252 Cramp and spasm: Secondary | ICD-10-CM | POA: Insufficient documentation

## 2021-05-18 DIAGNOSIS — M545 Low back pain, unspecified: Secondary | ICD-10-CM | POA: Diagnosis not present

## 2021-05-18 DIAGNOSIS — M546 Pain in thoracic spine: Secondary | ICD-10-CM | POA: Diagnosis not present

## 2021-05-19 ENCOUNTER — Encounter (HOSPITAL_BASED_OUTPATIENT_CLINIC_OR_DEPARTMENT_OTHER): Payer: Self-pay | Admitting: Physical Therapy

## 2021-05-19 NOTE — Therapy (Signed)
[facility] [address] [place], [place], [address] Phone: [phone]   Fax:  [phone]  Physical Therapy Treatment  Patient Details  Name: Mary Bright MRN: [id number] Date of Birth: [birth year] Referring Provider (PT): Dr Clementeen Graham   Encounter Date: 05/18/2021   PT End of Session - 05/19/21 0929     Visit Number 2    Number of Visits 6    Date for PT Re-Evaluation 06/23/21    Authorization Type --    PT Start Time 0930    PT Stop Time 1012    PT Time Calculation (min) 42 min    Activity Tolerance Patient tolerated treatment well    Behavior During Therapy [facility] for tasks assessed/performed             Past Medical History:  Diagnosis Date   Anxiety    Asthma    Depression    History of psych admission at age 37    Past Surgical History:  Procedure Laterality Date   HAND SURGERY Right 2015    There were no vitals filed for this visit.   Subjective Assessment - 05/18/21 2042     Subjective Patient reports she has had upper back pain today. She thinks it has to do with her bed and pillows. She likes to sleep on her side but feels like it may increase her pain. She also reports stiffness of her back.    Pertinent History , palpitations (nothing on ekg), fibromalgia, depression,    How long can you sit comfortably? 10-15 mins    How long can you stand comfortably? 5 mins    How long can you walk comfortably? not limited    Currently in Pain? Yes    Pain Score 6     Pain Location Back    Pain Orientation Right;Left    Pain Descriptors / Indicators Aching    Pain Type Chronic pain    Pain Radiating Towards buttocks, icy toes/ numbness    Pain Onset More than a month ago    Pain Frequency Constant    Aggravating Factors  carrying children, lying on her left side    Pain Relieving Factors rest    Effect of Pain on Daily Activities difficulty perfroming ADL's    Pain Score 6    Pain Location Neck    Pain  Orientation Right;Left    Pain Descriptors / Indicators Aching    Pain Type Chronic pain    Pain Onset More than a month ago    Pain Frequency Constant    Aggravating Factors  caryring children, certain activity    Pain Relieving Factors rest    Effect of Pain on Daily Activities difficulty perfroming                   Pt seen for aquatic therapy today.  Treatment took place in water 3.25-4 ft in depth at the [facility] pool. Temp of water was 91.  Pt entered/exited the pool via stairs (step through pattern) independently with bilat rail.  Introduction to water. Had patient stand at different levels so she could feel the bouncy   Warm up: heel/toe walking x4 laps across pool chest deep side stepping x4 laps chest deep   Ambulation with noodle support: long strides, march  side step all 2 laps   Exercises; Slow march x20; squats x20; hip extension x20; hip abduction x20; Sit to stand x20;    board trunk  flexion x10 lateral board rotation x10 each way seated   Seated LAQ and March x20 2lbs   Standing water weight press straight down x20; extension x20  Noodle push and pull x20 with core breathing; noodle press x20 with core breathing   Also performed with water weights Push and press x20 rainbow weights   Manual therapy: sub-occiputal release with gentle rhythmic movement from float position trigger point release to shoulders   float: bycycle 3 min flutter   Had patient float just on noodles but had some neck pain     Hamstring stretch 3x20 sec hold  Piriformis stretch 3x20 sec hold advised to try to stretch muscle but not the joint.     Pt requires buoyancy for support and to offload joints with strengthening exercises. Viscosity of the water is needed for resistance of strengthening; water current perturbations provides challenge to standing balance unsupported, requiring increased core activation.                    PT  Education - 05/18/21 2049     Education Details reviewed HEP and symptom management    Person(s) Educated Patient    Methods Explanation;Demonstration;Tactile cues;Verbal cues    Comprehension Verbalized understanding;Returned demonstration;Verbal cues required;Tactile cues required              PT Short Term Goals - 05/12/21 1055       PT SHORT TERM GOAL #1   Title Patient will report a 50% reduction in pain during the day    Time 3    Period Weeks    Status New    Target Date 06/02/21      PT SHORT TERM GOAL #2   Title Patient will be independnet with base strengthening program    Time 3    Period Weeks    Status New    Target Date 06/02/21      PT SHORT TERM GOAL #3   Title Patient will increase gorss UE and LE strength to 5/5    Time 3    Period Weeks    Status New    Target Date 06/02/21               PT Long Term Goals - 05/12/21 1056       PT LONG TERM GOAL #1   Title Patient will pickup a 60 year old child with proper technique with no increase in pain    Time 6    Period Weeks    Status New    Target Date 06/23/21      PT LONG TERM GOAL #2   Title Patient will be indepdnent with a full long term strengthening program with a good idea of progression.    Time 8    Period Weeks    Status New    Target Date 06/23/21      PT LONG TERM GOAL #3   Title Patient will work through a day without increased upper back or neck pain    Time 6    Period Weeks    Status New    Target Date 06/23/21                   Plan - 05/18/21 2052     Clinical Impression Statement Therapy perfromed manual therapy to the patients neck and shoulders. She tolerated well. We focused on posterior chain and hisp strengthening in the pool. At times she could feel it  in her upper trap but it was tolerable. She was advised to focus more on respose then sets and reps. We will continue to advance and work on manual therapy as tolerated. We will give her a laminated  copy of her exercises for home.    Personal Factors and Comorbidities Time since onset of injury/illness/exacerbation;Comorbidity 1;Comorbidity 2    Comorbidities Depression, Elhers Dunlos    Examination-Activity Limitations Locomotion Level;Reach Overhead;Squat;Stand;Lift;Carry    Examination-Participation Restrictions Cleaning;Occupation;Community Activity    Stability/Clinical Decision Making Evolving/Moderate complexity    Clinical Decision Making Moderate    Rehab Potential Excellent    PT Frequency 1x / week    PT Duration 6 weeks    PT Treatment/Interventions ADLs/Self Care Home Management;Electrical Stimulation;DME Instruction;Iontophoresis 4mg /ml Dexamethasone;Moist Heat;Traction;Therapeutic activities;Functional mobility training;Therapeutic exercise;Neuromuscular re-education;Patient/family education;Manual techniques;Passive range of motion;Dry needling;Taping    PT Next Visit Plan begin awuatic rehab. consider posturel corection and strengthening. Can likley tolerate higher level pool strengtheing    PT Home Exercise Plan Access Code: [email or web address]  URL: [email or web address]  Date: 05/12/2021  Prepared by: 05/14/2021    Exercises  Theracane Over Shoulder - 1 x daily - 7 x weekly - 3 sets - 10 reps  Theracane Under Arm - 1 x daily - 7 x weekly - 3 sets - 10 reps  Standing Bilateral Low Shoulder Row with Anchored Resistance - 1 x daily - 7 x weekly - 3 sets - 10 reps  Shoulder extension with resistance - Neutral - 1 x daily - 7 x weekly - 3 sets - 10 reps  Shoulder External Rotation and Scapular Retraction with Resistance - 1 x daily - 7 x weekly - 3 sets - 10 reps  Seated Upper Trapezius Stretch - 1 x daily - 7 x weekly - 3 sets - 10 reps    Consulted and Agree with Plan of Care Patient             Patient will benefit from skilled therapeutic intervention in order to improve the following deficits and impairments:  Decreased range of motion, Increased fascial  restricitons, Pain, Decreased mobility, Decreased strength, Postural dysfunction  Visit Diagnosis: Pain in thoracic spine  Chronic left-sided low back pain without sciatica  Cramp and spasm     Problem List Patient Active Problem List   Diagnosis Date Noted   Fibromyalgia 04/05/2021   EDS (Ehlers-Danlos syndrome) Suspected 04/05/2021   GAD (generalized anxiety disorder) 07/07/2019   ADD (attention deficit disorder) 07/07/2019   Childhood asthma 03/31/2014   Enlarged lymph node 03/31/2014   Dysmenorrhea 03/31/2014   Depression 12/17/2011    02/16/2012 05/19/2021, 9:42 AM  [facility] [address] [place], [place], [place] Phone: [phone]   Fax:  [phone]  Name: Mary Bright MRN: [id number] Date of Birth: Apr 19, 1996

## 2021-05-23 ENCOUNTER — Ambulatory Visit (HOSPITAL_BASED_OUTPATIENT_CLINIC_OR_DEPARTMENT_OTHER): Payer: BC Managed Care – PPO | Admitting: Physical Therapy

## 2021-05-29 ENCOUNTER — Encounter (HOSPITAL_BASED_OUTPATIENT_CLINIC_OR_DEPARTMENT_OTHER): Payer: Self-pay

## 2021-05-29 ENCOUNTER — Ambulatory Visit (HOSPITAL_BASED_OUTPATIENT_CLINIC_OR_DEPARTMENT_OTHER): Payer: BC Managed Care – PPO | Admitting: Physical Therapy

## 2021-06-07 DIAGNOSIS — F4312 Post-traumatic stress disorder, chronic: Secondary | ICD-10-CM | POA: Diagnosis not present

## 2021-06-07 DIAGNOSIS — F32A Depression, unspecified: Secondary | ICD-10-CM | POA: Diagnosis not present

## 2021-06-08 ENCOUNTER — Other Ambulatory Visit: Payer: Self-pay

## 2021-06-08 ENCOUNTER — Ambulatory Visit (INDEPENDENT_AMBULATORY_CARE_PROVIDER_SITE_OTHER): Payer: BC Managed Care – PPO | Admitting: Family Medicine

## 2021-06-08 VITALS — BP 126/80 | HR 70 | Wt 160.8 lb

## 2021-06-08 DIAGNOSIS — M26623 Arthralgia of bilateral temporomandibular joint: Secondary | ICD-10-CM

## 2021-06-08 DIAGNOSIS — M25531 Pain in right wrist: Secondary | ICD-10-CM

## 2021-06-08 DIAGNOSIS — M797 Fibromyalgia: Secondary | ICD-10-CM | POA: Diagnosis not present

## 2021-06-08 DIAGNOSIS — Q796 Ehlers-Danlos syndrome, unspecified: Secondary | ICD-10-CM

## 2021-06-08 NOTE — Progress Notes (Signed)
   I, Philbert Riser, LAT, ATC acting as a scribe for Clementeen Graham, MD.  Mary Bright is a 25 y.o. female who presents to [facility] today for worsening pain due to fibromyalgia and Ehlers-Danlos syndrome.  She was last seen by Dr. Denyse Amass on 04/04/21 and was advised to con't holistic treatment w/ con't activity as tolerated and recommended calling behavioral therapy for management of chronic pain. Additionally, an echocardiogram was ordered due to EDS's high risk. Pt has had prior facet injections, the last of which was performed on 02/08/21, w/ no benefit. Today, pt reports her pain has worsened, specifically to B upper arms, jaw, and B wrists, R>L. Pt has been prescribed Cymbalta, but has not yet begun taking it.  Cymbalta prescribed by psychiatry nurse practitioner for depression and anxiety.  Dx testing: 05/08/21 echo  01/07/21 L-spine MRI  12/28/20 C-spine & T-spine XR  12/19/20 L-spine XR  Pertinent review of systems: No fevers or chills  Relevant historical information: Ehlers-Danlos syndrome.  Fibromyalgia.   Exam:  BP 126/80   Pulse 70   Wt 160 lb 12.8 oz (72.9 kg)   SpO2 97%   BMI 25.18 kg/m  General: Well Developed, well nourished, and in no acute distress.   MSK: Jaw normal-appearing Normal motion.  Palpable click at TMJ with jaw motion occasionally.  Right wrist normal-appearing tender palpation dorsal ulnar wrist.  Normal wrist motion without palpable subluxation of the ECU tendon. Intact strength.  Some pain with resisted wrist extension and ulnar flexion.      Assessment and Plan: 25 y.o. 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  Obvious first step would be a bite guard.  Recommend patient try some over-the-counter soft bite guards available online and if needed moving to a custom hard bite guard.  This for the wrist pain thought to be related to ECU tendinitis.  It is possible she does have an occasional subluxation event although  this was not witnessed to clinic today.  Plan to treat with thumb spica splint as needed and Voltaren gel and home exercise program taught in clinic today.  Could proceed to steroid injection in future if needed.   General pain: Spent time today discussing overall pain issue which has been a chronic problem for her.  Think mostly due to fibromyalgia.  She is an excellent candidate to try Cymbalta especially because Lyrica at high doses was not as effective as he would like.  Discussed that she is to be very consistent with Cymbalta for her to be tolerable or effective.  She was prescribed 20 mg.  Recommend that she started.  Happy to take over prescribing Cymbalta as needed in the future.   Discussed warning signs or symptoms. Please see discharge instructions. Patient expresses understanding.   The above documentation has been reviewed and is accurate and complete Clementeen Graham, M.D.

## 2021-06-08 NOTE — Patient Instructions (Signed)
Thank you for coming in today.   Try to get a bite guard for TMJ .  Start the cymbalta.   Let me know if you need me to take over.   Work on home exercises for ECU tendonitis in th wrist.   Use a thumb spica splint as needed.   Please use Voltaren gel (Generic Diclofenac Gel) up to 4x daily for pain as needed.  This is available over-the-counter as both the name brand Voltaren gel and the generic diclofenac gel.   Let me know how this goes.   This is a lot of trial and error for you.

## 2021-06-15 DIAGNOSIS — F4312 Post-traumatic stress disorder, chronic: Secondary | ICD-10-CM | POA: Diagnosis not present

## 2021-06-21 DIAGNOSIS — F4312 Post-traumatic stress disorder, chronic: Secondary | ICD-10-CM | POA: Diagnosis not present

## 2021-06-21 DIAGNOSIS — F32A Depression, unspecified: Secondary | ICD-10-CM | POA: Diagnosis not present

## 2021-06-26 DIAGNOSIS — F4312 Post-traumatic stress disorder, chronic: Secondary | ICD-10-CM | POA: Diagnosis not present

## 2021-06-26 DIAGNOSIS — F32A Depression, unspecified: Secondary | ICD-10-CM | POA: Diagnosis not present

## 2021-06-28 DIAGNOSIS — F411 Generalized anxiety disorder: Secondary | ICD-10-CM | POA: Diagnosis not present

## 2021-06-28 DIAGNOSIS — F3181 Bipolar II disorder: Secondary | ICD-10-CM | POA: Diagnosis not present

## 2021-06-28 DIAGNOSIS — F902 Attention-deficit hyperactivity disorder, combined type: Secondary | ICD-10-CM | POA: Diagnosis not present

## 2021-07-05 ENCOUNTER — Other Ambulatory Visit: Payer: Self-pay

## 2021-07-05 ENCOUNTER — Ambulatory Visit (INDEPENDENT_AMBULATORY_CARE_PROVIDER_SITE_OTHER): Payer: BC Managed Care – PPO | Admitting: Family Medicine

## 2021-07-05 VITALS — BP 122/78 | HR 89 | Ht 67.0 in | Wt 159.8 lb

## 2021-07-05 DIAGNOSIS — M797 Fibromyalgia: Secondary | ICD-10-CM

## 2021-07-05 DIAGNOSIS — M25531 Pain in right wrist: Secondary | ICD-10-CM

## 2021-07-05 DIAGNOSIS — M26623 Arthralgia of bilateral temporomandibular joint: Secondary | ICD-10-CM | POA: Diagnosis not present

## 2021-07-05 NOTE — Progress Notes (Signed)
   I, Philbert Riser, LAT, ATC acting as a scribe for Clementeen Graham, MD.  Mary Bright is a 25 y.o. female who presents to [facility] today for f/u fibromyalgia and Ehlers-Danlos syndrome.  She was last seen by Dr. Denyse Amass on 06/08/21 c/o jaw pain thought to be due to TMJ and R wrist pain thought to be related to ECU tendinitis. Pt was advised try an OTC soft bite guard. For his wrist, pt was advised to wear a thumb spica splint as needed, use Voltaren gel and taught HEP. Today, pt reports she is still feeling pretty bad. Pt c/o all over back pain and cont R wrist and TMJ.   Dx testing: 05/08/21 echo  01/07/21 L-spine MRI             12/28/20 C-spine & T-spine XR             12/19/20 L-spine XR  Pertinent review of systems: No fevers or chills  Relevant historical information: EDS and fibromyalgia   Exam:  BP 122/78   Pulse 89   Ht 5\' 7"  (1.702 m)   Wt 159 lb 12.8 oz (72.5 kg)   SpO2 98%   BMI 25.03 kg/m  General: Well Developed, well nourished, and in no acute distress.   MSK: Right wrist normal.  Mild tender palpation around ulnar styloid.  Normal wrist motion. Normal spine motion. Normal gait.      Assessment and Plan: 25 y.o. female with right wrist pain thought to be ulnar subluxation.  Patient could certainly benefit from an x-ray at this point however she would like to minimize healthcare causes and is well enough on x-ray for now.  Watchful waiting recommended ECU stabilizing wrist brace.  TMJ: Patient has not purchased a nightguard yet.  Recommend proceed with that.  Chronic pain: Cymbalta marginally helpful to not helpful.  Its only been a month.  Recommend giving it another month or 2 and if not better weaning off of Cymbalta.  Recheck in 3 months or as needed.   PDMP not reviewed this encounter. No orders of the defined types were placed in this encounter.  No orders of the defined types were placed in this encounter.    Discussed  warning signs or symptoms. Please see discharge instructions. Patient expresses understanding.   The above documentation has been reviewed and is accurate and complete 30, M.D.

## 2021-07-05 NOTE — Patient Instructions (Addendum)
Thank you for coming in today.   WristWidget (Black) Adjustable Wrist Brace for TFCC Tears, One Size fits most. For Left and Right Wrists, Support for Weight Bearing Strain, Exercise  Bite Guard at night.   Consider PT.   Give the Cymbata another month or two.   If not improved let me know we can do more.   Consider recheck in 3 months or so.

## 2021-08-04 ENCOUNTER — Encounter: Payer: Self-pay | Admitting: Family Medicine

## 2021-08-04 MED ORDER — DULOXETINE HCL 20 MG PO CPEP: ORAL_CAPSULE | ORAL | 0 refills | Status: DC

## 2021-08-04 NOTE — Telephone Encounter (Signed)
Called Mary Bright back to confirm the dose.  She currently was taking 60 mg.  We will decrease to 40 to 40 mg a day for 5 days then 20 mg a day for 5 days then stop. New prescription sent to pharmacy.

## 2021-08-11 ENCOUNTER — Other Ambulatory Visit: Payer: Self-pay | Admitting: Family Medicine

## 2021-08-14 ENCOUNTER — Other Ambulatory Visit: Payer: Self-pay | Admitting: Family Medicine

## 2021-08-16 ENCOUNTER — Encounter: Payer: Self-pay | Admitting: Family Medicine

## 2021-08-17 MED ORDER — PREGABALIN 75 MG PO CAPS: ORAL_CAPSULE | ORAL | 1 refills | Status: DC

## 2021-08-24 DIAGNOSIS — F3181 Bipolar II disorder: Secondary | ICD-10-CM | POA: Diagnosis not present

## 2021-08-24 DIAGNOSIS — F902 Attention-deficit hyperactivity disorder, combined type: Secondary | ICD-10-CM | POA: Diagnosis not present

## 2021-08-24 DIAGNOSIS — F411 Generalized anxiety disorder: Secondary | ICD-10-CM | POA: Diagnosis not present

## 2021-09-04 ENCOUNTER — Encounter: Payer: Self-pay | Admitting: Family Medicine

## 2021-09-05 ENCOUNTER — Encounter (HOSPITAL_BASED_OUTPATIENT_CLINIC_OR_DEPARTMENT_OTHER): Payer: Self-pay | Admitting: Physical Therapy

## 2021-09-05 ENCOUNTER — Other Ambulatory Visit: Payer: Self-pay

## 2021-09-05 ENCOUNTER — Ambulatory Visit (HOSPITAL_BASED_OUTPATIENT_CLINIC_OR_DEPARTMENT_OTHER): Payer: BC Managed Care – PPO | Attending: Family Medicine | Admitting: Physical Therapy

## 2021-09-05 DIAGNOSIS — M546 Pain in thoracic spine: Secondary | ICD-10-CM | POA: Insufficient documentation

## 2021-09-05 DIAGNOSIS — R252 Cramp and spasm: Secondary | ICD-10-CM | POA: Diagnosis not present

## 2021-09-05 DIAGNOSIS — M6281 Muscle weakness (generalized): Secondary | ICD-10-CM | POA: Diagnosis not present

## 2021-09-05 NOTE — Therapy (Signed)
[place], [place], [address] Phone: [phone]   Fax:  [phone]  Physical Therapy Treatment/Re-eval  Patient Details  Name: Mary Bright MRN: [id number] Date of Birth: 08/18/1996 Referring Provider (PT): Dr Lynne Leader   Encounter Date: 09/05/2021   PT End of Session - 09/05/21 0842     Visit Number 3    Number of Visits 14    Date for PT Re-Evaluation 10/17/21    Authorization Type BCBS    PT Start Time 0807    PT Stop Time 0851    PT Time Calculation (min) 44 min    Activity Tolerance Patient tolerated treatment well    Behavior During Therapy [facility] for tasks assessed/performed             Past Medical History:  Diagnosis Date   Anxiety    Asthma    Depression    History of psych admission at age 34    Past Surgical History:  Procedure Laterality Date   HAND SURGERY Right 2015    There were no vitals filed for this visit.   Subjective Assessment - 09/05/21 0812     Subjective Pt was seen in PT for 2 visits in late July and early August.  Her following aquatic visit was cancelled due to thunderstorms and Pt did not call back to reschedule.  Pt reports her pain is worse which may be due to her work activities.  Pt reports increased pain with carrying children and tries to avoid it.  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  Pt has increased pain with work activites.  Pt reports she has pain with sitting, standing, and lying.  She has pain with activity though also has pain with being still for too long.  Pt reports stretches sometimes help but sometimes aggravate.  Pt states she has been performing some of her HEP though not consistently.  Pt states she wants to stick with land therapy due to  her work schedule and taking increased time to change and perform pool therapy.    Pertinent History palpitations (nothing on ekg), fibromalgia, depression,  possible Ehlers-Danlos  syndrome though not formally diagnosed    Currently in Pain? Yes    Pain Score --   5-6/10 current, 9/10 worst, 3-4/10 best   Pain Location --   cervical, lumbar, bilat UT, bilat hips   Aggravating Factors  sitting, standing, lying, activity, carrying children at work    Pain Relieving Factors heat                [facility] PT Assessment - 09/05/21 0001       AROM   AROM Assessment Site Cervical    Cervical Flexion WNL   with min pain   Cervical Extension 52    Cervical - Right Side Bend 39   with min R UT pain   Cervical - Left Side Bend 33   with min R UT pain   Cervical - Right Rotation WNL    Cervical - Left Rotation WNL      Strength   Right Shoulder Flexion 4+/5   lumbar pain   Right Shoulder ABduction 4-/5   shoulder pain   Right Shoulder Internal Rotation --   Intact tested in sitting   Right Shoulder External Rotation --   Intact tested in sitting   Left Shoulder Flexion 5/5   lumbar pain   Left Shoulder ABduction 4-/5  lumbar pain   Left Shoulder Internal Rotation --   weak, tested in sitting; lumbar pain   Left Shoulder External Rotation --   weak, tested in sitting; lumbar pain     Palpation   Palpation comment tenderness to palpation and tightness noted of upper trap R > L, medial scap mm, R > L cervical paraspinals.                           [facility] Adult PT Treatment/Exercise - 09/05/21 0001       Exercises   Exercises --   Reviewed HEP and instructed pt to continue with HEP.  Educated pt concerning POC.     Manual Therapy   Manual therapy comments STM to R sided cervical paraspinals in supine and to R UT seated to improve myofascial restrictions, pain, and soft tissue tightness                     PT Education - 09/05/21 1761     Education Details Reviewed HEP and instructed pt to cont with HEP.   Educated pt concerning POC.              PT Short Term Goals - 09/05/21 0843       PT SHORT TERM GOAL #1   Title  Patient will report a 50% reduction in pain during the day    Baseline 0%    Status Not Met      PT SHORT TERM GOAL #2   Title Patient will be independnet with base strengthening program    Status Not Met      PT SHORT TERM GOAL #3   Title Patient will increase gross UE and LE strength to 5/5    Status Not Met               PT Long Term Goals - 09/05/21 0844       PT LONG TERM GOAL #1   Title Patient will pickup a 30 year old child with proper technique with no increase in pain    Status Not Met      PT LONG TERM GOAL #2   Title Patient will be indepdnent with a full long term strengthening program with a good idea of progression.      PT LONG TERM GOAL #3   Title Patient will work through a day without increased upper back or neck pain    Status Not Met                   Plan - 09/05/21 0849     Clinical Impression Statement Pt has been absent from PT since early August.  She returns to PT today wanting to start PT back up again.  She prefers to just perform land therapy at this time.  Pt states she has performed some of her home exercises though has not been consistent.  Pt has increased pain with work activites and tries to avoid carrying kids.   Pt continues to have pain with sitting, standing, and lying.  Pt demonstrated improved strength in L shoulder flexion though has weakness in L shoulder > R shoulder.  Pt has soft tissue tightness in R > L UT and cervical mm.  Pt has not made progress toward her goals, but also has not been to PT and not been consistent with HEP.  Pt may benefit from skilled PT services to address  impairments and goals and to assist in restoring desired level of function.    Comorbidities fibromalgia, depression, possible Ehlers-Danlos syndrome though not formally diagnosed    PT Frequency --   1-2 times per weeks   PT Duration --   4-6 weeks   PT Treatment/Interventions ADLs/Self Care Home Management;Electrical Stimulation;DME  Instruction;Iontophoresis 66m/ml Dexamethasone;Moist Heat;Traction;Therapeutic activities;Functional mobility training;Therapeutic exercise;Neuromuscular re-education;Patient/family education;Manual techniques;Passive range of motion;Dry needling;Taping    PT Next Visit Plan cont with land therapy.  postural correction and strengthening.  soft tissue work to UT, cervical paraspinals, and medial scap mm    PT Home Exercise Plan Access Code: [email or web address] URL: [email or web address]  Date: 05/12/2021  Prepared by: DCarolyne Littles   Exercises  Theracane Over Shoulder - 1 x daily - 7 x weekly - 3 sets - 10 reps  Theracane Under Arm - 1 x daily - 7 x weekly - 3 sets - 10 reps  Standing Bilateral Low Shoulder Row with Anchored Resistance - 1 x daily - 7 x weekly - 3 sets - 10 reps  Shoulder extension with resistance - Neutral - 1 x daily - 7 x weekly - 3 sets - 10 reps  Shoulder External Rotation and Scapular Retraction with Resistance - 1 x daily - 7 x weekly - 3 sets - 10 reps  Seated Upper Trapezius Stretch - 1 x daily - 7 x weekly - 3 sets - 10 reps    Consulted and Agree with Plan of Care Patient             Patient will benefit from skilled therapeutic intervention in order to improve the following deficits and impairments:  Decreased range of motion, Increased fascial restricitons, Pain, Decreased mobility, Decreased strength, Postural dysfunction  Visit Diagnosis: Pain in thoracic spine  Cramp and spasm  Muscle weakness (generalized)     Problem List Patient Active Problem List   Diagnosis Date Noted   Fibromyalgia 04/05/2021   EDS (Ehlers-Danlos syndrome) Suspected 04/05/2021   GAD (generalized anxiety disorder) 07/07/2019   ADD (attention deficit disorder) 07/07/2019   Childhood asthma 03/31/2014   Enlarged lymph node 03/31/2014   Dysmenorrhea 03/31/2014   Depression 12/17/2011    RSelinda MichaelsIII PT, DPT 09/05/21 11:52 PM   [facility] [address] [address]Phone: [phone]  Fax:  [phone] Name: Mary VICARSMRN: [id number] of Birth: [birth year]

## 2021-09-14 ENCOUNTER — Encounter (HOSPITAL_BASED_OUTPATIENT_CLINIC_OR_DEPARTMENT_OTHER): Payer: Self-pay | Admitting: Physical Therapy

## 2021-09-14 ENCOUNTER — Ambulatory Visit (HOSPITAL_BASED_OUTPATIENT_CLINIC_OR_DEPARTMENT_OTHER): Payer: BC Managed Care – PPO | Attending: Family Medicine | Admitting: Physical Therapy

## 2021-09-14 ENCOUNTER — Other Ambulatory Visit: Payer: Self-pay

## 2021-09-14 DIAGNOSIS — R252 Cramp and spasm: Secondary | ICD-10-CM | POA: Insufficient documentation

## 2021-09-14 DIAGNOSIS — G8929 Other chronic pain: Secondary | ICD-10-CM | POA: Insufficient documentation

## 2021-09-14 DIAGNOSIS — M6281 Muscle weakness (generalized): Secondary | ICD-10-CM | POA: Diagnosis not present

## 2021-09-14 DIAGNOSIS — M546 Pain in thoracic spine: Secondary | ICD-10-CM | POA: Insufficient documentation

## 2021-09-14 DIAGNOSIS — M545 Low back pain, unspecified: Secondary | ICD-10-CM | POA: Diagnosis not present

## 2021-09-14 NOTE — Therapy (Signed)
[facility] [address] [place], [place], [address] Phone: [phone]   Fax:  [phone]  Physical Therapy Treatment  Patient Details  Name: Mary Bright MRN: [id number] Date of Birth: October 19, 1995 Referring Provider (PT): Dr Lynne Leader   Encounter Date: 09/14/2021   PT End of Session - 09/14/21 1345     Visit Number 4    Number of Visits 14    Date for PT Re-Evaluation 10/17/21    Authorization Type BCBS    PT Start Time 1311    PT Stop Time 1347    PT Time Calculation (min) 36 min    Activity Tolerance Patient tolerated treatment well    Behavior During Therapy [facility] for tasks assessed/performed             Past Medical History:  Diagnosis Date   Anxiety    Asthma    Depression    History of psych admission at age 12    Past Surgical History:  Procedure Laterality Date   HAND SURGERY Right 2015    There were no vitals filed for this visit.   Subjective Assessment - 09/14/21 1314     Subjective Pt states she was a little sore in her L side of trunk after prior Rx which she thinks is due from the strength testing.  pt states she has done her home exercises a little more.    Currently in Pain? Yes    Pain Score 5     Pain Location --   cervical, upper to mid thoracic, L side of trunk lumbar and lower thoracic.                              [facility] Adult PT Treatment/Exercise - 09/14/21 0001       Exercises   Exercises Shoulder   Reviewed response to prior Rx, HEP compliance, and pain levels.     Shoulder Exercises: Standing   Other Standing Exercises scap retraction 2x10 red; shoulder extension 2x10 YTB    Other Standing Exercises bilateral ER red 2x10      Manual Therapy   Manual therapy comments STM to bilat cervical paraspinals in supine and IASTM to bilat UT seated to improve myofascial restrictions, pain, and soft tissue tightness                     PT Education  - 09/14/21 2221     Education Details Reviewed HEP.  Exercise form.  rationale of MT and appropriate response to IASTM.    Person(s) Educated Patient    Methods Explanation;Demonstration;Verbal cues    Comprehension Verbalized understanding;Returned demonstration;Verbal cues required              PT Short Term Goals - 09/05/21 0843       PT SHORT TERM GOAL #1   Title Patient will report a 50% reduction in pain during the day    Baseline 0%    Status Not Met      PT SHORT TERM GOAL #2   Title Patient will be independnet with base strengthening program    Status Not Met      PT SHORT TERM GOAL #3   Title Patient will increase gross UE and LE strength to 5/5    Status Not Met               PT Long Term Goals - 09/05/21  0844       PT LONG TERM GOAL #1   Title Patient will pickup a 77 year old child with proper technique with no increase in pain    Status Not Met      PT LONG TERM GOAL #2   Title Patient will be indepdnent with a full long term strengthening program with a good idea of progression.      PT LONG TERM GOAL #3   Title Patient will work through a day without increased upper back or neck pain    Status Not Met                   Plan - 09/14/21 1340     Clinical Impression Statement Pt states she is performing more of her HEP recently.  Pt states she felt better after manual therapy.  She had an appropriate response to IASTM with multiple petichiae.  PT showed pt her response and educated pt in appropriate response.  Pt reports having some soreness with theraband exercises.  She states it "feels a little amped up" and reports increased pain from 5/10 before Rx to 6/10 after Rx.  Pt should benefit from cont skilled PT services to address goals and impairments and to assist in restoring desired level of function.    Comorbidities fibromalgia, depression, possible Ehlers-Danlos syndrome though not formally diagnosed    PT Next Visit Plan cont with  land therapy.  postural correction and strengthening.  soft tissue work to UT, cervical paraspinals, and medial scap mm    PT Home Exercise Plan Access Code: [email or web address]  URL: [email or web address]  Date: 05/12/2021  Prepared by: Carolyne Littles    Exercises  Theracane Over Shoulder - 1 x daily - 7 x weekly - 3 sets - 10 reps  Theracane Under Arm - 1 x daily - 7 x weekly - 3 sets - 10 reps  Standing Bilateral Low Shoulder Row with Anchored Resistance - 1 x daily - 7 x weekly - 3 sets - 10 reps  Shoulder extension with resistance - Neutral - 1 x daily - 7 x weekly - 3 sets - 10 reps  Shoulder External Rotation and Scapular Retraction with Resistance - 1 x daily - 7 x weekly - 3 sets - 10 reps  Seated Upper Trapezius Stretch - 1 x daily - 7 x weekly - 3 sets - 10 reps    Consulted and Agree with Plan of Care Patient             Patient will benefit from skilled therapeutic intervention in order to improve the following deficits and impairments:  Decreased range of motion, Increased fascial restricitons, Pain, Decreased mobility, Decreased strength, Postural dysfunction  Visit Diagnosis: Pain in thoracic spine  Cramp and spasm  Muscle weakness (generalized)     Problem List Patient Active Problem List   Diagnosis Date Noted   Fibromyalgia 04/05/2021   EDS (Ehlers-Danlos syndrome) Suspected 04/05/2021   GAD (generalized anxiety disorder) 07/07/2019   ADD (attention deficit disorder) 07/07/2019   Childhood asthma 03/31/2014   Enlarged lymph node 03/31/2014   Dysmenorrhea 03/31/2014   Depression 12/17/2011    Selinda Michaels III PT, DPT 09/14/21 10:25 PM    [facility] [address] [place], [place], [address] Phone: [phone]   Fax:  [phone]  Name: Mary Bright MRN: [id number] Date of Birth: 1995-12-06

## 2021-09-15 DIAGNOSIS — M797 Fibromyalgia: Secondary | ICD-10-CM | POA: Diagnosis not present

## 2021-09-15 DIAGNOSIS — Q796 Ehlers-Danlos syndrome, unspecified: Secondary | ICD-10-CM | POA: Diagnosis not present

## 2021-09-15 DIAGNOSIS — R002 Palpitations: Secondary | ICD-10-CM | POA: Diagnosis not present

## 2021-09-20 ENCOUNTER — Encounter (HOSPITAL_BASED_OUTPATIENT_CLINIC_OR_DEPARTMENT_OTHER): Payer: Self-pay | Admitting: Physical Therapy

## 2021-09-20 ENCOUNTER — Ambulatory Visit (HOSPITAL_BASED_OUTPATIENT_CLINIC_OR_DEPARTMENT_OTHER): Payer: BC Managed Care – PPO | Admitting: Physical Therapy

## 2021-09-20 ENCOUNTER — Other Ambulatory Visit: Payer: Self-pay

## 2021-09-20 DIAGNOSIS — M6281 Muscle weakness (generalized): Secondary | ICD-10-CM | POA: Diagnosis not present

## 2021-09-20 DIAGNOSIS — R252 Cramp and spasm: Secondary | ICD-10-CM | POA: Diagnosis not present

## 2021-09-20 DIAGNOSIS — M546 Pain in thoracic spine: Secondary | ICD-10-CM | POA: Diagnosis not present

## 2021-09-20 DIAGNOSIS — G8929 Other chronic pain: Secondary | ICD-10-CM | POA: Diagnosis not present

## 2021-09-20 DIAGNOSIS — M545 Low back pain, unspecified: Secondary | ICD-10-CM | POA: Diagnosis not present

## 2021-09-20 NOTE — Therapy (Signed)
[place], [place], [address] Phone: [phone]   Fax:  [phone]  Physical Therapy Treatment  Patient Details  Name: Mary Bright MRN: [id number] Date of Birth: [birth year] Referring Provider (PT): Dr Lynne Leader   Encounter Date: 09/20/2021   PT End of Session - 09/20/21 1441     Visit Number 5    Number of Visits 14    Date for PT Re-Evaluation 10/17/21    Authorization Type BCBS    PT Start Time 1437    PT Stop Time 1512    PT Time Calculation (min) 35 min    Activity Tolerance Patient tolerated treatment well    Behavior During Therapy [facility] for tasks assessed/performed             Past Medical History:  Diagnosis Date   Anxiety    Asthma    Depression    History of psych admission at age 34    Past Surgical History:  Procedure Laterality Date   HAND SURGERY Right 2015    There were no vitals filed for this visit.   Subjective Assessment - 09/20/21 1444     Subjective Pt reports having increased pain and soreness after prior Rx which lasted until the evening.  pt states she is doing less at home due to pain, but is doing more lifting at work.  Pt lies on yoga mat and uses heat at home.  Pt hasn't been performing the theraband exercises at home.  She has been performing scapular retractions, hz abd, and shoulder flexion without resistance.    Pertinent History palpitations (nothing on ekg), fibromalgia, depression,  possible Ehlers-Danlos syndrome though not formally diagnosed    Currently in Pain? Yes    Pain Score 7     Pain Location --   cervical down to lumbar                              [facility] Adult PT Treatment/Exercise - 09/20/21 0001       Exercises   Exercises --      Manual Therapy   Manual Therapy --   Reviewed response to prior Rx, HEP compliance, and pain levels.   Manual therapy comments STM to bilat cervical paraspinals, suboccipitals, and  UT in supine,;STM to thoracic paraspinals and medial scap mm in prone; and IASTM to bilat UT seated to improve myofascial restrictions, pain, and soft tissue tightness                       PT Short Term Goals - 09/05/21 0843       PT SHORT TERM GOAL #1   Title Patient will report a 50% reduction in pain during the day    Baseline 0%    Status Not Met      PT SHORT TERM GOAL #2   Title Patient will be independnet with base strengthening program    Status Not Met      PT SHORT TERM GOAL #3   Title Patient will increase gross UE and LE strength to 5/5    Status Not Met               PT Long Term Goals - 09/05/21 0844       PT LONG TERM GOAL #1   Title Patient will pickup a 62 year old child with proper technique with no  increase in pain    Status Not Met      PT LONG TERM GOAL #2   Title Patient will be indepdnent with a full long term strengthening program with a good idea of progression.      PT LONG TERM GOAL #3   Title Patient will work through a day without increased upper back or neck pain    Status Not Met                   Plan - 09/20/21 1516     Clinical Impression Statement Pt preferred not to perform theraband exercises today and PT didn't perform today.  PT just performed MT today. Pt had an appropriate response to IASTM with her more of a response on her L side than R side.  Pt had less petichiae present today.  Pt continues to have tightness in bilat UT though has improved in bilat UT compared to prior Rx's.  Pt reports a "tiny bit flared up" after Rx though not as much as last time.  Pt reports improved pain from 7/10 before Rx to 6/10 after Rx.    Comorbidities fibromalgia, depression, possible Ehlers-Danlos syndrome though not formally diagnosed    PT Treatment/Interventions ADLs/Self Care Home Management;Electrical Stimulation;DME Instruction;Iontophoresis 76m/ml Dexamethasone;Moist Heat;Traction;Therapeutic activities;Functional  mobility training;Therapeutic exercise;Neuromuscular re-education;Patient/family education;Manual techniques;Passive range of motion;Dry needling;Taping    PT Next Visit Plan cont with land therapy.  postural correction and strengthening.  soft tissue work to UT, cervical paraspinals, and medial scap mm    Consulted and Agree with Plan of Care Patient             Patient will benefit from skilled therapeutic intervention in order to improve the following deficits and impairments:  Decreased range of motion, Increased fascial restricitons, Pain, Decreased mobility, Decreased strength, Postural dysfunction  Visit Diagnosis: Pain in thoracic spine  Cramp and spasm  Muscle weakness (generalized)     Problem List Patient Active Problem List   Diagnosis Date Noted   Fibromyalgia 04/05/2021   EDS (Ehlers-Danlos syndrome) Suspected 04/05/2021   GAD (generalized anxiety disorder) 07/07/2019   ADD (attention deficit disorder) 07/07/2019   Childhood asthma 03/31/2014   Enlarged lymph node 03/31/2014   Dysmenorrhea 03/31/2014   Depression 12/17/2011    RSelinda MichaelsIII PT, DPT 09/20/21 11:12 PM   [facility] [address]Phone: [phone]  Fax:  [phone] Name: Mary GRANDISONMRN: [id number] of Birth: 110-10-1995

## 2021-09-22 ENCOUNTER — Ambulatory Visit (HOSPITAL_BASED_OUTPATIENT_CLINIC_OR_DEPARTMENT_OTHER): Payer: BC Managed Care – PPO | Admitting: Physical Therapy

## 2021-09-25 ENCOUNTER — Other Ambulatory Visit: Payer: Self-pay

## 2021-09-25 ENCOUNTER — Ambulatory Visit (HOSPITAL_BASED_OUTPATIENT_CLINIC_OR_DEPARTMENT_OTHER): Payer: BC Managed Care – PPO | Admitting: Physical Therapy

## 2021-09-25 ENCOUNTER — Encounter (HOSPITAL_BASED_OUTPATIENT_CLINIC_OR_DEPARTMENT_OTHER): Payer: Self-pay | Admitting: Physical Therapy

## 2021-09-25 DIAGNOSIS — M545 Low back pain, unspecified: Secondary | ICD-10-CM

## 2021-09-25 DIAGNOSIS — M6281 Muscle weakness (generalized): Secondary | ICD-10-CM | POA: Diagnosis not present

## 2021-09-25 DIAGNOSIS — R252 Cramp and spasm: Secondary | ICD-10-CM

## 2021-09-25 DIAGNOSIS — M546 Pain in thoracic spine: Secondary | ICD-10-CM

## 2021-09-25 DIAGNOSIS — G8929 Other chronic pain: Secondary | ICD-10-CM

## 2021-09-26 NOTE — Therapy (Signed)
[facility] [address] [place], [place], [address] Phone: [phone]   Fax:  [phone]  Physical Therapy Treatment  Patient Details  Name: Mary Bright MRN: [id number] Date of Birth: Mar 08, 1996 Referring Provider (PT): Dr Lynne Leader   Encounter Date: 09/25/2021   PT End of Session - 09/25/21 1611     Visit Number 6    Number of Visits 14    Date for PT Re-Evaluation 10/17/21    Authorization Type BCBS    PT Start Time 1432    PT Stop Time 1513    PT Time Calculation (min) 41 min    Activity Tolerance Patient tolerated treatment well    Behavior During Therapy [facility] for tasks assessed/performed             Past Medical History:  Diagnosis Date   Anxiety    Asthma    Depression    History of psych admission at age 61    Past Surgical History:  Procedure Laterality Date   HAND SURGERY Right 2015    There were no vitals filed for this visit.   Subjective Assessment - 09/25/21 1439     Subjective Patient continues to have pain in her mid back and lower back. The pain in her mid back is worse today. 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    Pertinent History palpitations (nothing on ekg), fibromalgia, depression,  possible Ehlers-Danlos syndrome though not formally diagnosed    How long can you sit comfortably? 10-15 mins    How long can you stand comfortably? 5 mins    How long can you walk comfortably? not limited    Currently in Pain? Yes    Pain Score 5     Pain Location Back    Pain Orientation Mid    Pain Descriptors / Indicators Aching    Pain Type Chronic pain    Pain Onset More than a month ago    Pain Frequency Intermittent    Aggravating Factors  standing/ activity    Pain Relieving Factors rest and heat    Effect of Pain on Daily Activities difficulty perfroming ADL's    Multiple Pain Sites No              supine wand flexion 2x10 with cuing for range Supine bilateral er  2x10 with cuing for grip. Patient had some fatigue with grip  Supine horizontal abduction 2x10 yellow   Suoine march 2x10  Supine clam shell 2x15 yellow  Manual: trigger point release from upper thoracic to lumbar paraspinals and QL                          PT Education - 09/26/21 0808     Education Details reviewed supine exercises vs seated    Person(s) Educated Patient    Methods Explanation;Tactile cues;Verbal cues;Demonstration    Comprehension Verbalized understanding;Returned demonstration;Verbal cues required;Tactile cues required              PT Short Term Goals - 09/05/21 0843       PT SHORT TERM GOAL #1   Title Patient will report a 50% reduction in pain during the day    Baseline 0%    Status Not Met      PT SHORT TERM GOAL #2   Title Patient will be independnet with base strengthening program    Status Not Met  PT SHORT TERM GOAL #3   Title Patient will increase gross UE and LE strength to 5/5    Status Not Met               PT Long Term Goals - 09/05/21 0844       PT LONG TERM GOAL #1   Title 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      PT LONG TERM GOAL #3   Title Patient will work through a day without increased upper back or neck pain    Status Not Met                   Plan - 09/25/21 1512     Clinical Impression Statement Patient was given exercises from a supine poition as it is more supported. She reported only a mild increase in pain in her upper back. She was advised seated may be easier to get done, but supine may be more supported and better at this point. She had spasming form her lumbar spine up her whole paraspinal to her upper thoriac spine L> R. Therapy perfromed manual therapy. she was  advised at home even if we can change the pain slightly with some pressure, it will improve outr ability to progress the exercises.    Personal Factors and Comorbidities Time since onset of injury/illness/exacerbation;Comorbidity 1;Comorbidity 2    Comorbidities fibromalgia, depression, possible Ehlers-Danlos syndrome though not formally diagnosed    Examination-Activity Limitations Locomotion Level;Reach Overhead;Squat;Stand;Lift;Carry    Stability/Clinical Decision Making Evolving/Moderate complexity    Clinical Decision Making Moderate    Rehab Potential Excellent    PT Frequency 2x / week    PT Duration 6 weeks    PT Treatment/Interventions ADLs/Self Care Home Management;Electrical Stimulation;DME Instruction;Iontophoresis 105m/ml Dexamethasone;Moist Heat;Traction;Therapeutic activities;Functional mobility training;Therapeutic exercise;Neuromuscular re-education;Patient/family education;Manual techniques;Passive range of motion;Dry needling;Taping    PT Next Visit Plan cont with land therapy.  postural correction and strengthening.  soft tissue work to UT, cervical paraspinals, and medial scap mm    PT Home Exercise Plan Access Code: [email or web address] URL: [email or web address]  Date: 05/12/2021  Prepared by: DCarolyne Littles   Exercises  Theracane Over Shoulder - 1 x daily - 7 x weekly - 3 sets - 10 reps  Theracane Under Arm - 1 x daily - 7 x weekly - 3 sets - 10 reps  Standing Bilateral Low Shoulder Row with Anchored Resistance - 1 x daily - 7 x weekly - 3 sets - 10 reps  Shoulder extension with resistance - Neutral - 1 x daily - 7 x weekly - 3 sets - 10 reps  Shoulder External Rotation and Scapular Retraction with Resistance - 1 x daily - 7 x weekly - 3 sets - 10 reps  Seated Upper Trapezius Stretch - 1 x daily - 7 x weekly - 3 sets - 10 reps    Consulted and Agree with Plan of Care Patient             Patient will benefit from skilled therapeutic intervention in order to improve the  following deficits and impairments:  Decreased range of motion, Increased fascial restricitons, Pain, Decreased mobility, Decreased strength, Postural dysfunction  Visit Diagnosis: Pain in thoracic spine  Cramp and spasm  Muscle weakness (generalized)  Chronic left-sided low back pain without sciatica     Problem List Patient Active Problem List   Diagnosis Date Noted   Fibromyalgia 04/05/2021   EDS (Ehlers-Danlos syndrome) Suspected 04/05/2021   GAD (generalized anxiety disorder) 07/07/2019   ADD (attention deficit disorder) 07/07/2019   Childhood asthma 03/31/2014   Enlarged lymph node 03/31/2014   Dysmenorrhea 03/31/2014   Depression 12/17/2011    Carney Living, PT 09/26/2021, 8:09 AM  [facility] [place], [place], [address] Phone: [phone]   Fax:  [phone]  Name: MATTELYN IMHOFF MRN: [id number] Date of Birth: 30-Oct-1995

## 2021-09-29 ENCOUNTER — Ambulatory Visit (HOSPITAL_BASED_OUTPATIENT_CLINIC_OR_DEPARTMENT_OTHER): Payer: BC Managed Care – PPO | Admitting: Physical Therapy

## 2021-10-02 ENCOUNTER — Encounter (HOSPITAL_BASED_OUTPATIENT_CLINIC_OR_DEPARTMENT_OTHER): Payer: Self-pay | Admitting: Physical Therapy

## 2021-10-02 ENCOUNTER — Other Ambulatory Visit: Payer: Self-pay

## 2021-10-02 ENCOUNTER — Ambulatory Visit (HOSPITAL_BASED_OUTPATIENT_CLINIC_OR_DEPARTMENT_OTHER): Payer: BC Managed Care – PPO | Admitting: Physical Therapy

## 2021-10-02 DIAGNOSIS — G8929 Other chronic pain: Secondary | ICD-10-CM | POA: Diagnosis not present

## 2021-10-02 DIAGNOSIS — M6281 Muscle weakness (generalized): Secondary | ICD-10-CM

## 2021-10-02 DIAGNOSIS — M545 Low back pain, unspecified: Secondary | ICD-10-CM | POA: Diagnosis not present

## 2021-10-02 DIAGNOSIS — R252 Cramp and spasm: Secondary | ICD-10-CM

## 2021-10-02 DIAGNOSIS — M546 Pain in thoracic spine: Secondary | ICD-10-CM

## 2021-10-02 NOTE — Therapy (Signed)
[place], [place], [address] Phone: [phone]   Fax:  [phone]  Physical Therapy Treatment  Patient Details  Name: Mary Bright MRN: [id number] Date of Birth: [birth year] Referring Provider (PT): Dr Lynne Leader   Encounter Date: 10/02/2021   PT End of Session - 10/02/21 0811     Visit Number 7    Number of Visits 14    Date for PT Re-Evaluation 10/17/21    Authorization Type BCBS    PT Start Time 0807    PT Stop Time 0846    PT Time Calculation (min) 39 min    Activity Tolerance Patient tolerated treatment well    Behavior During Therapy [facility] for tasks assessed/performed             Past Medical History:  Diagnosis Date   Anxiety    Asthma    Depression    History of psych admission at age 26    Past Surgical History:  Procedure Laterality Date   HAND SURGERY Right 2015    There were no vitals filed for this visit.   Subjective Assessment - 10/02/21 0812     Subjective Pt report no adverse effects after prior Rx.  Pt reports no improvements in her function or sx's and states she feels about the same.    Pertinent History palpitations (nothing on ekg), fibromalgia, depression,  possible Ehlers-Danlos syndrome though not formally diagnosed    Currently in Pain? Yes    Pain Score 6     Pain Location --   L sided lumbar > cervical and thoracic                              [facility] Adult PT Treatment/Exercise - 10/02/21 0001       Shoulder Exercises: Supine   Other Supine Exercises supine bilat shoulder ER wiht YTB, shoulder horizontal abduction with YTB 2x10      Shoulder Exercises: Seated   Other Seated Exercises rows with retraction with YTB 2x10 reps      Shoulder Exercises: Standing   Other Standing Exercises ball rolls on table cw and ccw x 10 each      Manual Therapy   Manual Therapy --   Reviewed response to prior Rx, functional statis. and pain  level   Manual therapy comments STM to thoracic paraspinals and medial scap mm and to L sided lumbar paraspinals with trigger pt release in prone; to improve restrictions, pain, and soft tissue tightness                       PT Short Term Goals - 09/05/21 0843       PT SHORT TERM GOAL #1   Title Patient will report a 50% reduction in pain during the day    Baseline 0%    Status Not Met      PT SHORT TERM GOAL #2   Title Patient will be independnet with base strengthening program    Status Not Met      PT SHORT TERM GOAL #3   Title Patient will increase gross UE and LE strength to 5/5    Status Not Met               PT Long Term Goals - 09/05/21 0844       PT LONG TERM GOAL #1   Title Patient will  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      PT LONG TERM GOAL #3   Title Patient will work through a day without increased upper back or neck pain    Status Not Met                   Plan - 10/02/21 0849     Clinical Impression Statement Pt presents to Rx reporting no improvement in function or sx's.  Pt had trigger points and soft tissue tightness in L sided lumbar paraspinals though R sided lumbar paraspinals tissue mobility was [facility].  Pt was able to perform supine and seated exercises without much c/o's.  She did have some popping with supine wand flexion.  Pt states her L sided lumbar felt better after STM.  Pt responded well to Rx reporting minimal improvement in pain with pain reducing from 6/10 before Rx to 5/10 after Rx.    Comorbidities fibromalgia, depression, possible Ehlers-Danlos syndrome though not formally diagnosed    PT Next Visit Plan cont with land therapy.  postural correction and strengthening.  soft tissue work to UT, cervical paraspinals, and  medial scap mm    PT Home Exercise Plan Access Code: [email or web address]  URL: [email or web address]  Date: 05/12/2021  Prepared by: Carolyne Littles    Exercises  Theracane Over Shoulder - 1 x daily - 7 x weekly - 3 sets - 10 reps  Theracane Under Arm - 1 x daily - 7 x weekly - 3 sets - 10 reps  Standing Bilateral Low Shoulder Row with Anchored Resistance - 1 x daily - 7 x weekly - 3 sets - 10 reps  Shoulder extension with resistance - Neutral - 1 x daily - 7 x weekly - 3 sets - 10 reps  Shoulder External Rotation and Scapular Retraction with Resistance - 1 x daily - 7 x weekly - 3 sets - 10 reps  Seated Upper Trapezius Stretch - 1 x daily - 7 x weekly - 3 sets - 10 reps    Consulted and Agree with Plan of Care Patient             Patient will benefit from skilled therapeutic intervention in order to improve the following deficits and impairments:  Decreased range of motion, Increased fascial restricitons, Pain, Decreased mobility, Decreased strength, Postural dysfunction  Visit Diagnosis: Pain in thoracic spine  Cramp and spasm  Muscle weakness (generalized)  Chronic left-sided low back pain without sciatica     Problem List Patient Active Problem List   Diagnosis Date Noted   Fibromyalgia 04/05/2021   EDS (Ehlers-Danlos syndrome) Suspected 04/05/2021   GAD (generalized anxiety disorder) 07/07/2019   ADD (attention deficit disorder) 07/07/2019   Childhood asthma 03/31/2014   Enlarged lymph node 03/31/2014   Dysmenorrhea 03/31/2014   Depression 12/17/2011    Selinda Michaels III PT, DPT 10/02/21 12:20 PM   [facility] [address] [place], [place], [address] Phone: [phone]   Fax:  [phone]  Name: Mary Bright MRN: [id number] Date of Birth: 1996-07-28

## 2021-10-04 ENCOUNTER — Encounter (HOSPITAL_BASED_OUTPATIENT_CLINIC_OR_DEPARTMENT_OTHER): Payer: Self-pay | Admitting: Physical Therapy

## 2021-10-04 ENCOUNTER — Other Ambulatory Visit: Payer: Self-pay

## 2021-10-04 ENCOUNTER — Ambulatory Visit (HOSPITAL_BASED_OUTPATIENT_CLINIC_OR_DEPARTMENT_OTHER): Payer: BC Managed Care – PPO | Admitting: Physical Therapy

## 2021-10-04 DIAGNOSIS — M546 Pain in thoracic spine: Secondary | ICD-10-CM

## 2021-10-04 DIAGNOSIS — M545 Low back pain, unspecified: Secondary | ICD-10-CM | POA: Diagnosis not present

## 2021-10-04 DIAGNOSIS — R252 Cramp and spasm: Secondary | ICD-10-CM | POA: Diagnosis not present

## 2021-10-04 DIAGNOSIS — M6281 Muscle weakness (generalized): Secondary | ICD-10-CM

## 2021-10-04 DIAGNOSIS — G8929 Other chronic pain: Secondary | ICD-10-CM | POA: Diagnosis not present

## 2021-10-04 NOTE — Therapy (Signed)
[place] °[facility] °[address] °[place], [place], [address] °Phone: [phone]   Fax:  [phone] ° °Physical Therapy Treatment ° °Patient Details  °Name: Mary Bright °MRN: [id number] °Date of Birth: 02/14/1996 °Referring Provider (PT): Dr Evan Corey ° ° °Encounter Date: 10/04/2021 ° ° PT End of Session - 10/04/21 0810   ° ° Visit Number 8   ° Number of Visits 14   ° Date for PT Re-Evaluation 10/17/21   ° Authorization Type BCBS   ° PT Start Time 0805   ° PT Stop Time 0850   ° PT Time Calculation (min) 45 min   ° Activity Tolerance Patient tolerated treatment well   ° Behavior During Therapy [facility] for tasks assessed/performed   ° °  °  ° °  ° ° °Past Medical History:  °Diagnosis Date  ° Anxiety   ° Asthma   ° Depression   ° History of psych admission at age 15  ° ° °Past Surgical History:  °Procedure Laterality Date  ° HAND SURGERY Right 2015  ° ° °There were no vitals filed for this visit. ° ° Subjective Assessment - 10/04/21 0810   ° ° Subjective Pt states she felt good after prior Rx though had increased pain at work babysitting.  Pt states her back felt better after prior rx.  Pt reports no improvement in function or sx's and states she feels about the same.  Pt states she thinks she can do the exercises better.  Pt states she must have slept wrong and woke up with L sided cervical pain.  She has minimal R sided cervical pain though worse on L.   ° Currently in Pain? Yes   ° Pain Score 6    ° Pain Location --   L medial scap/thoracic and L sided lumbar  ° Pain Score 7   ° Pain Location --   L sided UT and cervical  ° °  °  ° °  ° ° ° ° ° ° ° ° ° ° ° ° ° ° ° ° ° ° ° ° [facility] Adult PT Treatment/Exercise - 10/04/21 0001   ° °  ° Shoulder Exercises: Supine  ° Other Supine Exercises supine bilat shoulder ER wiht YTB, shoulder horizontal abduction with YTB 2x10   °  ° Shoulder Exercises: Seated  ° Other Seated Exercises rows with retraction with YTB 2x10 reps   °  °  Shoulder Exercises: Standing  ° Other Standing Exercises ball rolls on table cw and ccw x 10 each   °  ° Manual Therapy  ° Manual therapy comments IASTM to L > R sided UT seated; STM to thoracic paraspinals and medial scap mm and to L sided lumbar paraspinals with trigger pt release in prone; to improve restrictions, pain, and soft tissue tightness   ° °  °  ° °  ° ° ° ° ° ° ° ° ° ° PT Education - 10/04/21 0905   ° ° Education Details rationale of exercises and MT   ° Person(s) Educated Patient   ° Methods Explanation   ° Comprehension Verbalized understanding   ° °  °  ° °  ° ° ° PT Short Term Goals - 09/05/21 0843   ° °  ° PT SHORT TERM GOAL #1  ° Title Patient will report a 50% reduction in pain during the day   ° Baseline 0%   ° Status Not Met   °  °   PT SHORT TERM GOAL #2   Title Patient will be independnet with base strengthening program    Status Not Met      PT SHORT TERM GOAL #3   Title Patient will increase gross UE and LE strength to 5/5    Status Not Met               PT Long Term Goals - 09/05/21 0844       PT LONG TERM GOAL #1   Title Patient will pickup a 24 year old child with proper technique with no increase in pain    Status Not Met      PT LONG TERM GOAL #2   Title Patient will be indepdnent with a full long term strengthening program with a good idea of progression.      PT LONG TERM GOAL #3   Title Patient will work through a day without increased upper back or neck pain    Status Not Met                   Plan - 10/04/21 0858     Clinical Impression Statement Pt continues to have pain with work activities including lifting children.  Pt reports that her exercises are becoming easier.  PT gave her a RTB to take home with her to try if the yellow band is becoming too easy.  Pt responded well to STM and IASTM and had an appropriate response with IASTM having multiple petichiae in L UT.  Pt performed exercises well.  Pt responded well to Rx stating she felt  better after Rx and reported minimally improved pain in cervical and thoracic/lumbar from 7/10  to 6/10 and 6/10 before Rx to 5/10 after Rx respectively.  She should benfit from cont skilled PT to address ongoing goals and to improve overall function.    Comorbidities fibromalgia, depression, possible Ehlers-Danlos syndrome though not formally diagnosed    PT Treatment/Interventions ADLs/Self Care Home Management;Electrical Stimulation;DME Instruction;Iontophoresis 3m/ml Dexamethasone;Moist Heat;Traction;Therapeutic activities;Functional mobility training;Therapeutic exercise;Neuromuscular re-education;Patient/family education;Manual techniques;Passive range of motion;Dry needling;Taping    PT Next Visit Plan cont with land therapy.  postural correction and strengthening. STM and IASTM.    PT Home Exercise Plan Access Code: [email or web address] URL: [email or web address]  Date: 05/12/2021  Prepared by: DCarolyne Littles   Exercises  Theracane Over Shoulder - 1 x daily - 7 x weekly - 3 sets - 10 reps  Theracane Under Arm - 1 x daily - 7 x weekly - 3 sets - 10 reps  Standing Bilateral Low Shoulder Row with Anchored Resistance - 1 x daily - 7 x weekly - 3 sets - 10 reps  Shoulder extension with resistance - Neutral - 1 x daily - 7 x weekly - 3 sets - 10 reps  Shoulder External Rotation and Scapular Retraction with Resistance - 1 x daily - 7 x weekly - 3 sets - 10 reps  Seated Upper Trapezius Stretch - 1 x daily - 7 x weekly - 3 sets - 10 reps    Consulted and Agree with Plan of Care Patient             Patient will benefit from skilled therapeutic intervention in order to improve the following deficits and impairments:  Decreased range of motion, Increased fascial restricitons, Pain, Decreased mobility, Decreased strength, Postural dysfunction  Visit Diagnosis: Pain in thoracic spine  Cramp and spasm  Muscle weakness (generalized)  Chronic left-sided low back pain without  sciatica ° ° ° ° °Problem List °Patient Active Problem List  ° Diagnosis Date Noted  ° Fibromyalgia 04/05/2021  ° EDS (Ehlers-Danlos syndrome) Suspected 04/05/2021  ° GAD (generalized anxiety disorder) 07/07/2019  ° ADD (attention deficit disorder) 07/07/2019  ° Childhood asthma 03/31/2014  ° Enlarged lymph node 03/31/2014  ° Dysmenorrhea 03/31/2014  ° Depression 12/17/2011  ° ° °Roby Harrison III PT, DPT °10/04/21 9:08 AM ° ° °[place] °[facility] °[address] °[place], [place], [address] °Phone: [phone]   Fax:  [phone] ° °Name: Sharlisa A Kelch °MRN: [id number] °Date of Birth: 09/06/1996 ° ° ° °

## 2021-10-20 ENCOUNTER — Encounter: Payer: Self-pay | Admitting: Family Medicine

## 2021-10-20 ENCOUNTER — Telehealth (INDEPENDENT_AMBULATORY_CARE_PROVIDER_SITE_OTHER): Payer: BC Managed Care – PPO | Admitting: Family Medicine

## 2021-10-20 VITALS — Ht 67.0 in

## 2021-10-20 DIAGNOSIS — F902 Attention-deficit hyperactivity disorder, combined type: Secondary | ICD-10-CM | POA: Diagnosis not present

## 2021-10-20 DIAGNOSIS — U071 COVID-19: Secondary | ICD-10-CM

## 2021-10-20 DIAGNOSIS — J45909 Unspecified asthma, uncomplicated: Secondary | ICD-10-CM

## 2021-10-20 DIAGNOSIS — F3181 Bipolar II disorder: Secondary | ICD-10-CM | POA: Diagnosis not present

## 2021-10-20 DIAGNOSIS — F411 Generalized anxiety disorder: Secondary | ICD-10-CM | POA: Diagnosis not present

## 2021-10-20 MED ORDER — ALBUTEROL SULFATE HFA 108 (90 BASE) MCG/ACT IN AERS: 2.0000 | INHALATION_SPRAY | RESPIRATORY_TRACT | 1 refills | Status: AC | PRN

## 2021-10-20 MED ORDER — MOLNUPIRAVIR EUA 200MG CAPSULE: 4.0000 | ORAL_CAPSULE | Freq: Two times a day (BID) | ORAL | 0 refills | Status: AC

## 2021-10-20 NOTE — Progress Notes (Signed)
Virtual Visit via Video Note I connected with Mary Bright on 10/20/21 by a video enabled telemedicine application and verified that I am speaking with the correct person using two identifiers.  Location patient: home Location provider:work office Persons participating in the virtual visit: patient, provider  I discussed the limitations of evaluation and management by telemedicine and the availability of in person appointments. The patient expressed understanding and agreed to proceed.  Chief Complaint  Patient presents with   Covid Positive   HPI: Mary Bright is a 26 yo female with history of anxiety, depression, ADD, fibromyalgia,and asthma complaining of a day of respiratory symptoms. Mild nasal congestion yesterday. Today am subjective fever,chills, and body aches. + Fatigue.  URI  This is a new problem. The current episode started yesterday. The problem has been unchanged. Associated symptoms include congestion, coughing, rhinorrhea, a sore throat, swollen glands and wheezing. Pertinent negatives include no abdominal pain, chest pain, dysuria, ear pain, headaches, joint pain, joint swelling, nausea, plugged ear sensation, rash or vomiting. She has tried increased fluids for the symptoms.  Mildly productive cough, last night noted some blood mixed with sputum, she thinks it is because she bushed the back of her tongue "hard." "Little" wheezing. She used albuterol inhaler this morning.  Negative for dysphonia,ageusia,anosmia, SOB, palpitations, or changes in bowel habits. One bruise on LE. No nose bleed, blood in stool, melena, gross hematuria,or easy bruising.  Sick contact at work. No recent travel. COVID 19 vaccination completed.  She has taken Mucinex.  Home COVID-19 test positive today.  ROS: See pertinent positives and negatives per HPI.  Past Medical History:  Diagnosis Date   Anxiety    Asthma    Depression    History of psych admission at age 70   Past Surgical  History:  Procedure Laterality Date   HAND SURGERY Right 2015   Family History  Problem Relation Age of Onset   Depression Mother    Bipolar disorder Maternal Aunt    Bipolar disorder Maternal Grandfather    Alcohol abuse Paternal Grandfather    Healthy Father    Social History   Socioeconomic History   Marital status: Single    Spouse name: Not on file   Number of children: Not on file   Years of education: Not on file   Highest education level: Not on file  Occupational History   Not on file  Tobacco Use   Smoking status: Never   Smokeless tobacco: Never  Vaping Use   Vaping Use: Never used  Substance and Sexual Activity   Alcohol use: Yes    Comment: socially   Drug use: No   Sexual activity: Not on file  Other Topics Concern   Not on file  Social History Narrative   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   Social Determinants of Health   Financial Resource Strain: Not on file  Food Insecurity: Not on file  Transportation Needs: Not on file  Physical Activity: Not on file  Stress: Not on file  Social Connections: Not on file  Intimate Partner Violence: Not on file   Current Outpatient Medications:    albuterol (VENTOLIN HFA) 108 (90 Base) MCG/ACT inhaler, Inhale 2 puffs into the lungs every 4 (four) hours as needed for wheezing or shortness of breath., Disp: 8 g, Rfl: 0   pregabalin (LYRICA) 75 MG capsule, TAKE 2 CAPSULES(150 MG) BY MOUTH TWICE DAILY AS NEEDED  FOR NERVE PAIN, Disp: 120 capsule, Rfl: 1  EXAM:  VITALS per patient if applicable:Ht 5\' 7"  (1.702 m)    LMP 10/10/2021    BMI 25.03 kg/m   GENERAL: alert, oriented, appears well and in no acute distress  HEENT: atraumatic, conjunctiva clear, no obvious abnormalities on inspection of external nose and ears  NECK: normal movements of the head and neck  LUNGS: on inspection no signs of respiratory distress, breathing rate appears normal,  no obvious gross SOB, gasping or wheezing  CV: no obvious cyanosis  MS: moves all visible extremities without noticeable abnormality  PSYCH/NEURO: pleasant and cooperative, no obvious depression or anxiety, speech and thought processing grossly intact  ASSESSMENT AND PLAN:  Discussed the following assessment and plan:  COVID-19 virus infection We discussed Dx,possible complications and treatment options. She has a mild to moderate case with some risk for complications due to hx of asthma. We discussed oral antiviral options and side effects. She agrees with trying Molnupiravir. Symptomatic treatment with plenty of fluids,rest,tylenol 500 mg 3-4 times per day prn. Throat lozenges if needed for sore throat. Monitor for new symptoms. Recommend completing 7 days of quarantine.  Explained that cough and congestion may last a few more days and even weeks after acute symptoms have resolved. Clearly instructed about warning signs.  Mild reactive airways disease, unspecified whether persistent I do not think prednisone is needed at this time. Albuterol inh 2 puff every 6 hours for a week then as needed for wheezing or shortness of breath.  Instructed about warning signs.  We discussed possible serious and likely etiologies, options for evaluation and workup, limitations of telemedicine visit vs in person visit, treatment, treatment risks and precautions. The patient was advised to call back or seek an in-person evaluation if the symptoms worsen or if the condition fails to improve as anticipated. I discussed the assessment and treatment plan with the patient. The patient was provided an opportunity to ask questions and all were answered. The patient agreed with the plan and demonstrated an understanding of the instructions.  Return if symptoms worsen or fail to improve.  Betty G. Martinique, MD  [facility]. [place] office.

## 2021-10-22 ENCOUNTER — Other Ambulatory Visit: Payer: Self-pay | Admitting: Family Medicine

## 2021-10-22 ENCOUNTER — Encounter: Payer: Self-pay | Admitting: Family Medicine

## 2021-10-23 ENCOUNTER — Telehealth: Payer: Self-pay

## 2021-10-23 MED ORDER — PREGABALIN 75 MG PO CAPS: ORAL_CAPSULE | ORAL | 0 refills | Status: DC

## 2021-10-23 NOTE — Telephone Encounter (Signed)
Called Mary Bright and relayed Dr. Clovis Riley advice regarding how to taper off of her Lyrica.  Mary Bright verbalizes understanding.

## 2021-10-23 NOTE — Telephone Encounter (Signed)
Patient states that it has been 24 hours without her medication and she is going through [employer] Lyrica needs refilled to [facility]

## 2021-10-23 NOTE — Telephone Encounter (Signed)
Based on conversation.  Well start Lyrica taper.  Refilled medication but will start to taper down based on desire to get off of the Lyrica.

## 2021-10-23 NOTE — Telephone Encounter (Signed)
Dr Georgina Snell patient. Please advise (and see previous messages and request for pharmacy).  Patient would like a call back as soon as possible.

## 2021-10-24 ENCOUNTER — Ambulatory Visit (HOSPITAL_BASED_OUTPATIENT_CLINIC_OR_DEPARTMENT_OTHER): Payer: BC Managed Care – PPO | Admitting: Physical Therapy

## 2021-10-27 ENCOUNTER — Encounter (HOSPITAL_BASED_OUTPATIENT_CLINIC_OR_DEPARTMENT_OTHER): Payer: BC Managed Care – PPO | Admitting: Physical Therapy

## 2021-10-30 ENCOUNTER — Ambulatory Visit (HOSPITAL_BASED_OUTPATIENT_CLINIC_OR_DEPARTMENT_OTHER): Payer: BC Managed Care – PPO | Attending: Family Medicine | Admitting: Physical Therapy

## 2021-10-30 DIAGNOSIS — G8929 Other chronic pain: Secondary | ICD-10-CM | POA: Insufficient documentation

## 2021-10-30 DIAGNOSIS — M546 Pain in thoracic spine: Secondary | ICD-10-CM | POA: Insufficient documentation

## 2021-10-30 DIAGNOSIS — R252 Cramp and spasm: Secondary | ICD-10-CM | POA: Insufficient documentation

## 2021-10-30 DIAGNOSIS — M545 Low back pain, unspecified: Secondary | ICD-10-CM | POA: Insufficient documentation

## 2021-10-30 DIAGNOSIS — M6281 Muscle weakness (generalized): Secondary | ICD-10-CM | POA: Insufficient documentation

## 2021-11-06 ENCOUNTER — Ambulatory Visit (HOSPITAL_BASED_OUTPATIENT_CLINIC_OR_DEPARTMENT_OTHER): Payer: BC Managed Care – PPO | Admitting: Physical Therapy

## 2021-11-06 ENCOUNTER — Encounter (HOSPITAL_BASED_OUTPATIENT_CLINIC_OR_DEPARTMENT_OTHER): Payer: Self-pay | Admitting: Physical Therapy

## 2021-11-06 ENCOUNTER — Other Ambulatory Visit: Payer: Self-pay

## 2021-11-06 DIAGNOSIS — M546 Pain in thoracic spine: Secondary | ICD-10-CM | POA: Diagnosis not present

## 2021-11-06 DIAGNOSIS — R252 Cramp and spasm: Secondary | ICD-10-CM

## 2021-11-06 DIAGNOSIS — M545 Low back pain, unspecified: Secondary | ICD-10-CM | POA: Diagnosis not present

## 2021-11-06 DIAGNOSIS — M6281 Muscle weakness (generalized): Secondary | ICD-10-CM | POA: Diagnosis not present

## 2021-11-06 DIAGNOSIS — G8929 Other chronic pain: Secondary | ICD-10-CM | POA: Diagnosis not present

## 2021-11-07 ENCOUNTER — Encounter (HOSPITAL_BASED_OUTPATIENT_CLINIC_OR_DEPARTMENT_OTHER): Payer: Self-pay | Admitting: Physical Therapy

## 2021-11-07 NOTE — Therapy (Signed)
[facility] [address] [place], [place], [address] Phone: [phone]   Fax:  [phone]  Physical Therapy Treatment  Patient Details  Name: Mary Bright MRN: [id number] Date of Birth: 09-12-1996 Referring Provider (PT): Dr Clementeen [facility]   Encounter Date: 11/06/2021   PT End of Session - 11/06/21 1540     Visit Number 9    Number of Visits 17    Date for PT Re-Evaluation 01/01/22    Authorization Type BCBS    PT Start Time 1524   Patient 9 minutes late   PT Stop Time 1602    PT Time Calculation (min) 38 min    Activity Tolerance Patient tolerated treatment well    Behavior During Therapy [facility] for tasks assessed/performed             Past Medical History:  Diagnosis Date   Anxiety    Asthma    Depression    History of psych admission at age 26    Past Surgical History:  Procedure Laterality Date   HAND SURGERY Right 2015    There were no vitals filed for this visit.   Subjective Assessment - 11/06/21 1528     Subjective Patient had COVID. In that time she was out of work. She reports that the shoulders felt better when she was out of work. Since she has gone back her pain has become progressively worse. Todya she is having more pain in hte right upper trap and shoulder.    Pertinent History palpitations (nothing on ekg), fibromalgia, depression,  possible Ehlers-Danlos syndrome though not formally diagnosed    How long can you sit comfortably? 10-15 mins    How long can you stand comfortably? 5 mins    How long can you walk comfortably? not limited    Currently in Pain? Yes    Pain Score 7     Pain Location Neck    Pain Orientation Right    Pain Descriptors / Indicators Aching    Pain Type Chronic pain    Pain Onset More than a month ago    Pain Frequency Intermittent    Aggravating Factors  using the shoulder, standing and walking    Effect of Pain on Daily Activities difficult yperfromeing ADL's                Manual therapy: trigger point release to upper traps and the cervical spine   Exercises: supine Wand flexion 3 x10 1lb  Bilateral er 3x10 yellow Horizontal abduction 3x10 yellow    Reviewed tests and measures and POC going forward                          PT Education - 11/06/21 1540     Education Details reviewed tsts and measures    Person(s) Educated Patient    Methods Explanation;Demonstration;Tactile cues;Verbal cues    Comprehension Verbalized understanding;Returned demonstration;Verbal cues required;Tactile cues required              PT Short Term Goals - 11/07/21 1611       PT SHORT TERM GOAL #1   Title Patient will report a 50% reduction in pain during the day    Baseline had a period of time where it improved but now it is back to where it was with her period of illness    Time 3    Period Weeks    Status On-going  Target Date 12/05/21      PT SHORT TERM GOAL #2   Title Patient will be independnet with base strengthening program    Baseline she is progressing but we still have things to add    Time 3    Period Weeks    Status On-going    Target Date 12/05/21      PT SHORT TERM GOAL #3   Title Patient will increase gross UE and LE strength to 5/5 or by 5 lbs    Baseline mesured with nad dyno. 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    Baseline has not been able to progress much    Time 8    Period Weeks    Status On-going    Target Date 01/02/22       PT LONG TERM GOAL #3   Title Patient will work through a day without increased upper back or neck pain    Baseline continues to have significant pain    Time 8    Period Weeks    Status On-going    Target Date 06/23/21                   Plan - 11/07/21 1456     Clinical Impression Statement Patient's continues to have spasming in her upper trap and into her cervical spine. Her cervical pine motion is doing well, but she has pain with right rotation. Therapy reviewed her strength measurements using the hand dyno. We will assess it as we go along. Her pain free strengthn numbers are below age releasted norms.She has not done any therapy for a month 2nd to illness. She flet like she was making progress before she had this set back. We will continue 1W8 to continue to work on reduceing the spasm in her upper trap while improving the strength.    Personal Factors and Comorbidities Time since onset of injury/illness/exacerbation;Comorbidity 1;Comorbidity 2    Comorbidities fibromalgia, depression, possible Ehlers-Danlos syndrome though not formally diagnosed    Examination-Activity Limitations Locomotion Level;Reach Overhead;Squat;Stand;Lift;Carry    Examination-Participation Restrictions Cleaning;Occupation;Community Activity    Stability/Clinical Decision Making Evolving/Moderate complexity    Clinical Decision Making Moderate    Rehab Potential Excellent    PT Frequency 2x / week    PT Duration 8 weeks    PT Treatment/Interventions ADLs/Self Care Home Management;Electrical Stimulation;DME Instruction;Iontophoresis 4mg /ml Dexamethasone;Moist Heat;Traction;Therapeutic activities;Functional mobility training;Therapeutic exercise;Neuromuscular re-education;Patient/family education;Manual techniques;Passive range of motion;Dry needling;Taping    PT Next Visit Plan cont with land therapy.  postural correction and strengthening. STM and IASTM.    PT Home Exercise Plan Access Code: [email or web address]   URL: [email or web address]  Date: 05/12/2021  Prepared by: 05/14/2021    Exercises  Theracane Over Shoulder - 1 x  daily - 7 x weekly - 3 sets - 10 reps  Theracane Under Arm - 1 x daily - 7 x weekly - 3 sets - 10 reps  Standing Bilateral Low Shoulder Row with Anchored Resistance - 1 x daily - 7 x weekly - 3 sets - 10 reps  Shoulder extension with resistance - Neutral - 1 x daily - 7 x weekly - 3 sets - 10 reps  Shoulder External Rotation and Scapular Retraction with Resistance - 1 x daily - 7 x weekly - 3 sets - 10 reps  Seated Upper Trapezius Stretch - 1 x daily - 7 x weekly - 3 sets - 10 reps    Consulted and Agree with Plan of Care Patient             Patient will benefit from skilled therapeutic intervention in order to improve the following deficits and impairments:  Decreased range of motion, Increased fascial restricitons, Pain, Decreased mobility, Decreased strength, Postural dysfunction  Visit Diagnosis: Pain in thoracic spine  Cramp and spasm  Muscle weakness (generalized)  Chronic left-sided low back pain without sciatica     Problem List Patient Active Problem List   Diagnosis Date Noted   Fibromyalgia 04/05/2021   EDS (Ehlers-Danlos syndrome) Suspected 04/05/2021   GAD (generalized anxiety disorder) 07/07/2019   ADD (attention deficit disorder) 07/07/2019   Childhood asthma 03/31/2014   Enlarged lymph node 03/31/2014   Dysmenorrhea 03/31/2014   Depression 12/17/2011    Dessie Comaavid J Dilyn Osoria, PT 11/07/2021, 4:34 PM  [facility] [address] [place], [place], [address] Phone: [phone]   Fax:  [phone]  Name: Zigmund DanielJenna A Bright MRN: [id number] Date of Birth: 12/11/1995

## 2021-11-20 DIAGNOSIS — H6505 Acute serous otitis media, recurrent, left ear: Secondary | ICD-10-CM | POA: Diagnosis not present

## 2021-11-20 DIAGNOSIS — Z20828 Contact with and (suspected) exposure to other viral communicable diseases: Secondary | ICD-10-CM | POA: Diagnosis not present

## 2021-11-20 DIAGNOSIS — Z20822 Contact with and (suspected) exposure to covid-19: Secondary | ICD-10-CM | POA: Diagnosis not present

## 2021-11-20 DIAGNOSIS — Z03818 Encounter for observation for suspected exposure to other biological agents ruled out: Secondary | ICD-10-CM | POA: Diagnosis not present

## 2021-11-20 DIAGNOSIS — J029 Acute pharyngitis, unspecified: Secondary | ICD-10-CM | POA: Diagnosis not present

## 2021-11-22 ENCOUNTER — Encounter (HOSPITAL_BASED_OUTPATIENT_CLINIC_OR_DEPARTMENT_OTHER): Payer: Self-pay | Admitting: Physical Therapy

## 2021-11-22 ENCOUNTER — Ambulatory Visit (HOSPITAL_BASED_OUTPATIENT_CLINIC_OR_DEPARTMENT_OTHER): Payer: BC Managed Care – PPO | Attending: Family Medicine | Admitting: Physical Therapy

## 2021-11-22 ENCOUNTER — Other Ambulatory Visit: Payer: Self-pay

## 2021-11-22 DIAGNOSIS — R252 Cramp and spasm: Secondary | ICD-10-CM | POA: Insufficient documentation

## 2021-11-22 DIAGNOSIS — M545 Low back pain, unspecified: Secondary | ICD-10-CM | POA: Diagnosis not present

## 2021-11-22 DIAGNOSIS — M6281 Muscle weakness (generalized): Secondary | ICD-10-CM | POA: Diagnosis not present

## 2021-11-22 DIAGNOSIS — G8929 Other chronic pain: Secondary | ICD-10-CM | POA: Diagnosis not present

## 2021-11-22 DIAGNOSIS — M546 Pain in thoracic spine: Secondary | ICD-10-CM | POA: Insufficient documentation

## 2021-11-22 NOTE — Therapy (Signed)
[place], [place], [address] Phone: [phone]   Fax:  [phone]  Physical Therapy Treatment  Patient Details  Name: Mary Bright MRN: [id number] Date of Birth: [birth year] Referring Provider (PT): Dr Lynne Leader   Encounter Date: 11/22/2021   PT End of Session - 11/22/21 1618     Visit Number 10    Number of Visits 17    Date for PT Re-Evaluation 01/01/22    Authorization Type BCBS    PT Start Time 0809    PT Stop Time 0847    PT Time Calculation (min) 38 min    Activity Tolerance Patient tolerated treatment well    Behavior During Therapy [facility] for tasks assessed/performed             Past Medical History:  Diagnosis Date   Anxiety    Asthma    Depression    History of psych admission at age 15    Past Surgical History:  Procedure Laterality Date   HAND SURGERY Right 2015    There were no vitals filed for this visit.   Subjective Assessment - 11/22/21 0839     Subjective Patient reports that she is better today, but she was sore yesterday. she took the day off yesterday.    Pertinent History palpitations (nothing on ekg), fibromalgia, depression,  possible Ehlers-Danlos syndrome though not formally diagnosed    How long can you sit comfortably? 10-15 mins    How long can you stand comfortably? 5 mins    How long can you walk comfortably? not limited    Currently in Pain? Yes    Pain Score 4     Pain Location Neck    Pain Orientation Right    Pain Descriptors / Indicators Aching    Pain Type Chronic pain    Pain Onset More than a month ago    Pain Frequency Intermittent    Aggravating Factors  use of the shoulder    Pain Relieving Factors rest and heat    Effect of Pain on Daily Activities difficulty perfroming ADL's    Multiple Pain Sites No                      Manual: taping of the upper trap; reviewed how to do and the  purpose.   Supine wand flexion 3x10  2lbs  Supine bilateral er yellow 3x10  Supine bridge 3x10  Supine march with yellow band 3x10  Supine hip abduction 3x10                     PT Education - 11/22/21 1618     Education Details reviewed benefits and risks of taping    Person(s) Educated Patient    Methods Explanation;Verbal cues;Demonstration;Tactile cues    Comprehension Verbalized understanding;Returned demonstration;Verbal cues required;Tactile cues required              PT Short Term Goals - 11/07/21 1611       PT SHORT TERM GOAL #1   Title Patient will report a 50% reduction in pain during the day    Baseline had a period of time where it improved but now it is back to where it was with her period of illness    Time 3    Period Weeks    Status On-going    Target Date 12/05/21      PT SHORT TERM GOAL #2  Title Patient will be independnet with base strengthening program    Baseline she is progressing but we still have things to add    Time 3    Period Weeks    Status On-going    Target Date 12/05/21      PT SHORT TERM GOAL #3   Title Patient will increase gross UE and LE strength to 5/5 or by 5 lbs    Baseline mesured with nad dyno. 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    Baseline has not been able to progress much    Time 8    Period Weeks    Status On-going    Target Date 01/02/22      PT LONG TERM GOAL #3   Title Patient will work through a day without increased upper back or neck pain    Baseline  continues to have significant pain    Time 8    Period Weeks    Status On-going    Target Date 06/23/21                   Plan - 11/22/21 0841     Clinical Impression Statement The patient is doing better today, but she had a bad day yesterday. We broached the subject of needling again. She has had a good and a bad expierience with it in the past We trialed inhibition taping instead. Therapy also gave her a lower extremity series of exercises. These exercises will work her postural musces, but also give her alternative exercises to work on when her upper trap is spasmed after work. She feels like her left leg is longer. Upon visual inspection that appears to be true. It could be due to intability or a mechanical issue. She reported either way it felt better with a 2 level wedge on the left side. Next visit we will review response to taping and advance exercises as tolerated.    Personal Factors and Comorbidities Time since onset of injury/illness/exacerbation;Comorbidity 1;Comorbidity 2    Comorbidities fibromalgia, depression, possible Ehlers-Danlos syndrome though not formally diagnosed    Examination-Activity Limitations Locomotion Level;Reach Overhead;Squat;Stand;Lift;Carry    Examination-Participation Restrictions Cleaning;Occupation;Community Activity    Stability/Clinical Decision Making Evolving/Moderate complexity    Clinical Decision Making Moderate    Rehab Potential Excellent    PT Frequency 2x / week    PT Duration 6 weeks    PT Treatment/Interventions ADLs/Self Care Home Management;Electrical Stimulation;DME Instruction;Iontophoresis 4mg /ml Dexamethasone;Moist Heat;Traction;Therapeutic activities;Functional mobility training;Therapeutic exercise;Neuromuscular re-education;Patient/family education;Manual techniques;Passive range of motion;Dry needling;Taping    PT Next Visit Plan cont with land therapy.  postural correction and strengthening. STM and IASTM.    PT Home  Exercise Plan Access Code: [email or web address]  URL: [email or web address]  Date: 05/12/2021  Prepared  by: Carolyne Littles    Exercises  Theracane Over Shoulder - 1 x daily - 7 x weekly - 3 sets - 10 reps  Theracane Under Arm - 1 x daily - 7 x weekly - 3 sets - 10 reps  Standing Bilateral Low Shoulder Row with Anchored Resistance - 1 x daily - 7 x weekly - 3 sets - 10 reps  Shoulder extension with resistance - Neutral - 1 x daily - 7 x weekly - 3 sets - 10 reps  Shoulder External Rotation and Scapular Retraction with Resistance - 1 x daily - 7 x weekly - 3 sets - 10 reps  Seated Upper Trapezius Stretch - 1 x daily - 7 x weekly - 3 sets - 10 reps    Consulted and Agree with Plan of Care Patient             Patient will benefit from skilled therapeutic intervention in order to improve the following deficits and impairments:  Decreased range of motion, Increased fascial restricitons, Pain, Decreased mobility, Decreased strength, Postural dysfunction  Visit Diagnosis: Pain in thoracic spine  Cramp and spasm  Muscle weakness (generalized)  Chronic left-sided low back pain without sciatica     Problem List Patient Active Problem List   Diagnosis Date Noted   Fibromyalgia 04/05/2021   EDS (Ehlers-Danlos syndrome) Suspected 04/05/2021   GAD (generalized anxiety disorder) 07/07/2019   ADD (attention deficit disorder) 07/07/2019   Childhood asthma 03/31/2014   Enlarged lymph node 03/31/2014   Dysmenorrhea 03/31/2014   Depression 12/17/2011    Carney Living, PT 11/22/2021, 4:26 PM  [facility] [address] [place], [place], [address] Phone: [phone]   Fax:  [phone]  Name: Mary Bright MRN: [id number] Date of Birth: [birth year]

## 2021-11-28 ENCOUNTER — Other Ambulatory Visit: Payer: Self-pay

## 2021-11-28 ENCOUNTER — Ambulatory Visit (HOSPITAL_BASED_OUTPATIENT_CLINIC_OR_DEPARTMENT_OTHER): Payer: BC Managed Care – PPO | Admitting: Physical Therapy

## 2021-11-28 ENCOUNTER — Encounter (HOSPITAL_BASED_OUTPATIENT_CLINIC_OR_DEPARTMENT_OTHER): Payer: Self-pay | Admitting: Physical Therapy

## 2021-11-28 DIAGNOSIS — M545 Low back pain, unspecified: Secondary | ICD-10-CM

## 2021-11-28 DIAGNOSIS — R252 Cramp and spasm: Secondary | ICD-10-CM

## 2021-11-28 DIAGNOSIS — M546 Pain in thoracic spine: Secondary | ICD-10-CM | POA: Diagnosis not present

## 2021-11-28 DIAGNOSIS — G8929 Other chronic pain: Secondary | ICD-10-CM | POA: Diagnosis not present

## 2021-11-28 DIAGNOSIS — M6281 Muscle weakness (generalized): Secondary | ICD-10-CM | POA: Diagnosis not present

## 2021-11-28 NOTE — Therapy (Signed)
[place], [place], [address] Phone: [phone]   Fax:  [phone]  Physical Therapy Treatment  Patient Details  Name: Mary Bright MRN: [id number] Date of Birth: 1996/02/21 Referring Provider (PT): Dr Lynne Leader   Encounter Date: 11/28/2021   PT End of Session - 11/28/21 0859     Visit Number 11    Number of Visits 17    Date for PT Re-Evaluation 01/01/22    Authorization Type BCBS    PT Start Time 0806   Patient 6 minutes late   PT Stop Time 0845    PT Time Calculation (min) 39 min    Activity Tolerance Patient tolerated treatment well    Behavior During Therapy [facility] for tasks assessed/performed             Past Medical History:  Diagnosis Date   Anxiety    Asthma    Depression    History of psych admission at age 86    Past Surgical History:  Procedure Laterality Date   HAND SURGERY Right 2015    There were no vitals filed for this visit.   Subjective Assessment - 11/28/21 0825     Subjective The patient reports the tape worked while she was at work. She took it off at home. She feels like the heel lift is working well. She was given the name of the heel wedge.    Pertinent History palpitations (nothing on ekg), fibromalgia, depression,  possible Ehlers-Danlos syndrome though not formally diagnosed    How long can you sit comfortably? 10-15 mins    How long can you stand comfortably? 5 mins    How long can you walk comfortably? not limited    Currently in Pain? Yes    Pain Score 5     Pain Location Back    Pain Orientation Right;Left    Pain Descriptors / Indicators Aching    Pain Type Chronic pain    Pain Onset More than a month ago    Pain Frequency Intermittent    Aggravating Factors  ue of the shoulder    Pain Relieving Factors rest and heat    Effect of Pain on Daily Activities diffiuclty perfroming ADL's    Multiple Pain Sites No                  LTR: IN  PAIN FREE MOTION x20  Gluteal stretch 2x20 sec hold bilateral   Supine: march 2x10 with education on progression  Bridge 2x5  Supine clam shell 3x10 yellow  Supine PPT 3x10   nu-step 5 min L2                         PT Education - 11/28/21 1436     Education Details reviewed LE core strengthneing and exercises; updated HEP    Person(s) Educated Patient    Methods Explanation;Demonstration;Tactile cues;Verbal cues    Comprehension Verbalized understanding;Returned demonstration;Verbal cues required;Tactile cues required              PT Short Term Goals - 11/07/21 1611       PT SHORT TERM GOAL #1   Title Patient will report a 50% reduction in pain during the day    Baseline had a period of time where it improved but now it is back to where it was with her period of illness    Time 3    Period Weeks  Status On-going    Target Date 12/05/21      PT SHORT TERM GOAL #2   Title Patient will be independnet with base strengthening program    Baseline she is progressing but we still have things to add    Time 3    Period Weeks    Status On-going    Target Date 12/05/21      PT SHORT TERM GOAL #3   Title Patient will increase gross UE and LE strength to 5/5 or by 5 lbs    Baseline mesured with nad dyno. 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    Baseline has not been able to progress much    Time 8    Period Weeks    Status On-going    Target Date 01/02/22      PT LONG TERM GOAL #3    Title Patient will work through a day without increased upper back or neck pain    Baseline continues to have significant pain    Time 8    Period Weeks    Status On-going    Target Date 06/23/21                   Plan - 11/28/21 1441     Clinical Impression Statement The patient came in today with lower back discomfort . Therapy reviewed baseline stretching and strengthening for the lower back. She tolerated well. She reported no increase in pan. Patient was shown were to buy the leuko tape. She was encouraged to continue her home exercises for her core and postrul muscles.    Personal Factors and Comorbidities Time since onset of injury/illness/exacerbation;Comorbidity 1;Comorbidity 2    Comorbidities fibromalgia, depression, possible Ehlers-Danlos syndrome though not formally diagnosed    Examination-Activity Limitations Locomotion Level;Reach Overhead;Squat;Stand;Lift;Carry    Examination-Participation Restrictions Cleaning;Occupation;Community Activity    Stability/Clinical Decision Making Evolving/Moderate complexity    Clinical Decision Making Moderate    Rehab Potential Excellent    PT Frequency 2x / week    PT Duration 6 weeks    PT Treatment/Interventions ADLs/Self Care Home Management;Electrical Stimulation;DME Instruction;Iontophoresis 4mg /ml Dexamethasone;Moist Heat;Traction;Therapeutic activities;Functional mobility training;Therapeutic exercise;Neuromuscular re-education;Patient/family education;Manual techniques;Passive range of motion;Dry needling;Taping    PT Next Visit Plan cont with land therapy.  postural correction and strengthening. STM and IASTM.    PT Home Exercise Plan Access Code: [email or web address]  URL: [email or web address]  Date: 05/12/2021  Prepared by: Carolyne Littles    Exercises  Theracane Over Shoulder - 1 x daily - 7 x weekly - 3 sets - 10 reps  Theracane Under Arm - 1 x daily - 7 x weekly - 3 sets - 10 reps  Standing Bilateral Low Shoulder  Row with Anchored Resistance - 1 x daily -  7 x weekly - 3 sets - 10 reps  Shoulder extension with resistance - Neutral - 1 x daily - 7 x weekly - 3 sets - 10 reps  Shoulder External Rotation and Scapular Retraction with Resistance - 1 x daily - 7 x weekly - 3 sets - 10 reps  Seated Upper Trapezius Stretch - 1 x daily - 7 x weekly - 3 sets - 10 reps    Consulted and Agree with Plan of Care Patient             Patient will benefit from skilled therapeutic intervention in order to improve the following deficits and impairments:  Decreased range of motion, Increased fascial restricitons, Pain, Decreased mobility, Decreased strength, Postural dysfunction  Visit Diagnosis: Pain in thoracic spine  Cramp and spasm  Muscle weakness (generalized)  Chronic left-sided low back pain without sciatica     Problem List Patient Active Problem List   Diagnosis Date Noted   Fibromyalgia 04/05/2021   EDS (Ehlers-Danlos syndrome) Suspected 04/05/2021   GAD (generalized anxiety disorder) 07/07/2019   ADD (attention deficit disorder) 07/07/2019   Childhood asthma 03/31/2014   Enlarged lymph node 03/31/2014   Dysmenorrhea 03/31/2014   Depression 12/17/2011    Carney Living, PT 11/28/2021, 2:46 PM  [facility] [address] [place], [place], [address] Phone: [phone]   Fax:  [phone]  Name: Mary Bright MRN: [id number] Date of Birth: Aug 30, 1996

## 2021-12-05 ENCOUNTER — Ambulatory Visit (HOSPITAL_BASED_OUTPATIENT_CLINIC_OR_DEPARTMENT_OTHER): Payer: BC Managed Care – PPO | Admitting: Physical Therapy

## 2021-12-05 ENCOUNTER — Encounter (HOSPITAL_BASED_OUTPATIENT_CLINIC_OR_DEPARTMENT_OTHER): Payer: Self-pay | Admitting: Physical Therapy

## 2021-12-05 ENCOUNTER — Other Ambulatory Visit: Payer: Self-pay

## 2021-12-05 DIAGNOSIS — R252 Cramp and spasm: Secondary | ICD-10-CM

## 2021-12-05 DIAGNOSIS — G8929 Other chronic pain: Secondary | ICD-10-CM | POA: Diagnosis not present

## 2021-12-05 DIAGNOSIS — M546 Pain in thoracic spine: Secondary | ICD-10-CM | POA: Diagnosis not present

## 2021-12-05 DIAGNOSIS — M545 Low back pain, unspecified: Secondary | ICD-10-CM | POA: Diagnosis not present

## 2021-12-05 DIAGNOSIS — M6281 Muscle weakness (generalized): Secondary | ICD-10-CM | POA: Diagnosis not present

## 2021-12-05 NOTE — Therapy (Signed)
[facility] [address] [place], [place], [address] Phone: [phone]   Fax:  [phone]  Physical Therapy Treatment  Patient Details  Name: Mary Bright MRN: [id number] Date of Birth: 1996/01/29 Referring Provider (PT): Dr Clementeen Graham   Encounter Date: 12/05/2021   PT End of Session - 12/05/21 0838     Visit Number 12    Number of Visits 17    Date for PT Re-Evaluation 01/01/22    Authorization Type BCBS    PT Start Time 0810   Patient 10 min late   PT Stop Time 0845    PT Time Calculation (min) 35 min    Activity Tolerance Patient tolerated treatment well    Behavior During Therapy [facility] for tasks assessed/performed             Past Medical History:  Diagnosis Date   Anxiety    Asthma    Depression    History of psych admission at age 11    Past Surgical History:  Procedure Laterality Date   HAND SURGERY Right 2015    There were no vitals filed for this visit.   Subjective Assessment - 12/05/21 0834     Subjective The patient is flaired up today. She is having some pain in her ribs and sternum Shis is new pain.    Pertinent History palpitations (nothing on ekg), fibromalgia, depression,  possible Ehlers-Danlos syndrome though not formally diagnosed    How long can you sit comfortably? 10-15 mins    How long can you stand comfortably? 5 mins    How long can you walk comfortably? not limited    Currently in Pain? Yes    Pain Score 5     Pain Location Sternum    Pain Orientation Right;Left    Pain Descriptors / Indicators Aching    Pain Type Chronic pain    Pain Onset More than a month ago    Pain Frequency Intermittent    Aggravating Factors  not sure    Pain Relieving Factors just comes and goes               Open book stretch 2x6 bilateral ( felt good at first but then pain ful  Supine with ball:  Ball press LE 2x15 pain in knees   DKTC 2x10   LTR on ball 2x10   Reviewed  decompression from this position with deep breathing   Seated ball roll x15 lateral and x10 forward   Seated ball press 3x10  ( could feel it in her left lower back                                 PT Education - 12/05/21 0837     Education Details reviewed the use of the exercises ball    Person(s) Educated Patient    Methods Explanation;Demonstration;Tactile cues;Verbal cues    Comprehension Verbalized understanding;Returned demonstration;Verbal cues required;Tactile cues required              PT Short Term Goals - 12/05/21 0958       PT SHORT TERM GOAL #1   Title Patient will report a 50% reduction in pain during the day    Baseline still moves to different areas    Time 3    Period Weeks    Status On-going    Target Date 12/05/21      PT  SHORT TERM GOAL #2   Title Patient will be independnet with base strengthening program    Baseline she is progressing but we still have things to add    Time 3    Period Weeks    Status On-going    Target Date 12/05/21      PT SHORT TERM GOAL #3   Title Patient will increase gross UE and LE strength to 5/5 or by 5 lbs    Baseline continue to monitor    Time 3    Period Weeks    Status On-going    Target Date 12/05/21               PT Long Term Goals - 11/07/21 1615       PT LONG TERM GOAL #1   Title 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    Baseline has not been able to progress much    Time 8    Period Weeks    Status On-going    Target Date 01/02/22      PT LONG TERM GOAL #3   Title Patient will work through a day without increased upper back or neck pain    Baseline continues to have  significant pain    Time 8    Period Weeks    Status On-going    Target Date 06/23/21                   Plan - 12/05/21 2863     Clinical Impression Statement Patient was limited by time today. We reviewed therapy ball exercises and open book stretching. She could feel pain in different areas with hte different wexercises. She had pain in her left lowe rback with seted UE ball press. She had knee pain with double knee to chest. Therapy also reviewed the decompression position with her and how she can use that at home along with breathing to reduce her symptoms. . We were able to get her scheduled with our ED specialist    Personal Factors and Comorbidities Time since onset of injury/illness/exacerbation;Comorbidity 1;Comorbidity 2    Comorbidities fibromalgia, depression, possible Ehlers-Danlos syndrome though not formally diagnosed    Examination-Activity Limitations Locomotion Level;Reach Overhead;Squat;Stand;Lift;Carry    Examination-Participation Restrictions Cleaning;Occupation;Community Activity    Stability/Clinical Decision Making Evolving/Moderate complexity    Clinical Decision Making Moderate    Rehab Potential Excellent    PT Frequency 2x / week    PT Duration 6 weeks    PT Treatment/Interventions ADLs/Self Care Home Management;Electrical Stimulation;DME Instruction;Iontophoresis 4mg /ml Dexamethasone;Moist Heat;Traction;Therapeutic activities;Functional mobility training;Therapeutic exercise;Neuromuscular re-education;Patient/family education;Manual techniques;Passive range of motion;Dry needling;Taping    PT Home Exercise Plan Access Code: [email or web address]  URL: [email or web address]  Date: 05/12/2021  Prepared by: Lorayne Bender    Exercises  Theracane Over Shoulder - 1 x daily - 7 x weekly - 3 sets - 10 reps  Theracane Under Arm - 1 x daily - 7 x weekly - 3 sets - 10 reps  Standing Bilateral Low Shoulder Row with Anchored Resistance - 1 x daily - 7 x weekly - 3  sets  - 10 reps  Shoulder extension with resistance - Neutral - 1 x daily - 7 x weekly - 3 sets - 10 reps  Shoulder External Rotation and Scapular Retraction with Resistance - 1 x daily - 7 x weekly - 3 sets - 10 reps  Seated Upper Trapezius Stretch - 1 x daily - 7 x weekly - 3 sets - 10 reps    Consulted and Agree with Plan of Care Patient             Patient will benefit from skilled therapeutic intervention in order to improve the following deficits and impairments:  Decreased range of motion, Increased fascial restricitons, Pain, Decreased mobility, Decreased strength, Postural dysfunction  Visit Diagnosis: Pain in thoracic spine  Cramp and spasm  Muscle weakness (generalized)  Chronic left-sided low back pain without sciatica     Problem List Patient Active Problem List   Diagnosis Date Noted   Fibromyalgia 04/05/2021   EDS (Ehlers-Danlos syndrome) Suspected 04/05/2021   GAD (generalized anxiety disorder) 07/07/2019   ADD (attention deficit disorder) 07/07/2019   Childhood asthma 03/31/2014   Enlarged lymph node 03/31/2014   Dysmenorrhea 03/31/2014   Depression 12/17/2011    Dessie Coma, PT 12/05/2021, 10:00 AM  [facility] [address] [place], [place], [address] Phone: [phone]   Fax:  [phone]  Name: Mary Bright MRN: [id number] Date of Birth: Dec 14, 1995

## 2021-12-12 ENCOUNTER — Ambulatory Visit (HOSPITAL_BASED_OUTPATIENT_CLINIC_OR_DEPARTMENT_OTHER): Payer: BC Managed Care – PPO | Admitting: Physical Therapy

## 2021-12-21 NOTE — Therapy (Addendum)
OUTPATIENT PHYSICAL THERAPY TREATMENT NOTE DISCHARGE   Patient Name: Mary Bright MRN: [id number] DOB:1995-11-07, 26 y.o., female Today's Date: 12/22/2021  PCP: Martinique, Betty G, MD REFERRING PROVIDER: [place], Betty G, MD, Dr. Lynne Leader   PT End of Session - 12/22/21 0810     Visit Number 13    Number of Visits 21    Date for PT Re-Evaluation 02/16/22    Authorization Type BCBS    PT Start Time 0806    PT Stop Time 0850    PT Time Calculation (min) 44 min    Activity Tolerance Patient tolerated treatment well    Behavior During Therapy [facility] for tasks assessed/performed             Past Medical History:  Diagnosis Date   Anxiety    Asthma    Depression    History of psych admission at age 41   Past Surgical History:  Procedure Laterality Date   HAND SURGERY Right 2015   Patient Active Problem List   Diagnosis Date Noted   Fibromyalgia 04/05/2021   EDS (Ehlers-Danlos syndrome) Suspected 04/05/2021   GAD (generalized anxiety disorder) 07/07/2019   ADD (attention deficit disorder) 07/07/2019   Childhood asthma 03/31/2014   Enlarged lymph node 03/31/2014   Dysmenorrhea 03/31/2014   Depression 12/17/2011    REFERRING DIAG: Ehlers-Danlos Syndrome (EDS), neck pain   THERAPY DIAG:  Pain in thoracic spine  Cramp and spasm  Muscle weakness (generalized)  Chronic left-sided low back pain without sciatica  PERTINENT HISTORY: Anxiety Asthma Depression   PRECAUTIONS: EDS suspected , joint hypermobility   SUBJECTIVE: Patient transferred from [place] to this location for more specialized therapy for EDS symptoms.  She has been off the Lyrica.  She has pain in mid to low back   PAIN:  Are you having pain? Yes NPRS scale: 6/10 Pain location: mid to low back  Pain orientation: Bilateral  PAIN TYPE: aching Pain description: constant and aching  Aggravating factors: activity, malposition  Relieving factors: rest, support to joint   Major  joint involvement/complaints- neck, back and hips   History of dislocation/subluxations- Not that she recalls   What have you tried? What has or has not worked? Has done PT before for multiple issues   Do you know your triggers for pain, instability, other symptoms?   Assessment (as on reg. Template) Beighton Scale: 8/9  Lumbar (1_/1) Knees (2_/2) Elbows (_2/2)  5th digit (_1/2) Thumb (2_/2) Comment on hips, shoulders       TODAY'S TREATMENT:  Intro to new clinic  Other medical/health professionals to help HEP, POC    [facility] Adult PT Treatment:                                                DATE: 12/22/21 Therapeutic Exercise: Neutral spine Pelvic tilt with abdominals  Tr A  March (reverse)  Clam  Prone knee bend with TrA  Self Care: See above Neutral spine, joint support  core stab.  Dr. Oneida Alar, rheumatology?   PATIENT EDUCATION: Education details: See above  Person educated: Patient Education method: Explanation, Demonstration, Verbal cues, and Handouts Education comprehension: verbalized understanding and needs further education   HOME EXERCISE PROGRAM: [email or web address]    PT Short Term Goals - 12/05/21 0958       PT SHORT TERM GOAL #1  Title Patient will report a 50% reduction in pain during the day    Baseline Pain continues    Time 3    Period Weeks    Status On-going    Target Date 01/19/22     PT SHORT TERM GOAL #2   Title Patient will be independent with base strengthening program    Baseline Ongoing for joint stabilization   Time 3    Period Weeks    Status On-going    Target Date 01/19/22     PT SHORT TERM GOAL #3   Title Patient will increase gross UE and LE strength to 5/5 or by 5 lbs    Baseline 4+/5 in shoulders and hips     Time 3    Period Weeks    Status On-going    Target Date 01/19/22             PT Long Term Goals - 11/07/21 1615       PT LONG TERM GOAL #1   Title 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    Baseline has not been able to progress much    Time 8    Period Weeks    Status On-going    Target Date 02/16/22     PT LONG TERM GOAL #3   Title Patient will work through a day without increased upper back or neck pain    Baseline continues to have significant pain    Time 8    Period Weeks    Status On-going    Target Date 02/16/22              Plan - 12/05/21 5956       Clinical Impression Statement Patient here to this clinic to extend POC with more specialized and focus with a PT more familiar with EDS and treatment.  She did well today, given info and HEP for Transverse Abdominus and progression.  She shows instability in L spine, with spinal rotation with hip extension, hypermobility and decreased NM control of ROM. She will benefit from skilled PT to allow her to tolerate lifting loads and bending without exacerbating symptoms.     Personal Factors and Comorbidities Time since onset of injury/illness/exacerbation;Comorbidity 1;Comorbidity 2     Comorbidities fibromalgia, depression, possible Ehlers-Danlos syndrome though not formally diagnosed     Examination-Activity Limitations Locomotion Level;Reach Overhead;Squat;Stand;Lift;Carry     Examination-Participation Restrictions Cleaning;Occupation;Community Activity     Stability/Clinical Decision Making Evolving/Moderate complexity     Clinical Decision Making Moderate     Rehab Potential Excellent     PT Frequency 1x / week     PT Duration 8 weeks     PT Treatment/Interventions ADLs/Self Care Home Management;Electrical Stimulation;DME Instruction;Iontophoresis 40m/ml Dexamethasone;Moist Heat;Traction;Therapeutic activities;Functional mobility training;Therapeutic  exercise;Neuromuscular re-education;Patient/family education;Manual techniques;Passive range of motion;Dry needling;Taping , pilates         Hildreth Robart, PT 12/22/2021, 12:37 PM  JRaeford Razor PT 12/22/21 12:46 PM Phone: [phone]: [phone]     PHYSICAL THERAPY DISCHARGE SUMMARY  Visits from Start of Care: 13  Current functional level related to goals / functional  outcomes: See above   Remaining deficits: unknown   Education / Equipment: HEP    Patient agrees to discharge. Patient goals were not met. Patient is being discharged due to not returning since the last visit.  Raeford Razor, PT 02/14/22 9:55 AM Phone: [phone] Fax: [phone]

## 2021-12-22 ENCOUNTER — Ambulatory Visit: Payer: BC Managed Care – PPO | Attending: Family Medicine | Admitting: Physical Therapy

## 2021-12-22 ENCOUNTER — Other Ambulatory Visit: Payer: Self-pay

## 2021-12-22 ENCOUNTER — Encounter: Payer: Self-pay | Admitting: Physical Therapy

## 2021-12-22 DIAGNOSIS — R252 Cramp and spasm: Secondary | ICD-10-CM | POA: Diagnosis not present

## 2021-12-22 DIAGNOSIS — M6281 Muscle weakness (generalized): Secondary | ICD-10-CM | POA: Diagnosis not present

## 2021-12-22 DIAGNOSIS — M546 Pain in thoracic spine: Secondary | ICD-10-CM | POA: Diagnosis not present

## 2021-12-22 DIAGNOSIS — M545 Low back pain, unspecified: Secondary | ICD-10-CM | POA: Diagnosis not present

## 2021-12-22 DIAGNOSIS — G8929 Other chronic pain: Secondary | ICD-10-CM | POA: Diagnosis not present

## 2021-12-22 DIAGNOSIS — M2480 Other specific joint derangements of unspecified joint, not elsewhere classified: Secondary | ICD-10-CM | POA: Insufficient documentation

## 2021-12-22 NOTE — Progress Notes (Signed)
ACUTE VISIT Chief Complaint  Patient presents with   Back Pain   tested for pots   HPI: Mary Bright is a 26 y.o. female with hx of fibromyalgia,polyarthralgia, GAD,and ADD today complaining of lower back pain and palpitations. These are not new problems. She follows with sport medicine, recently dx'ed with Ehlers-Danlos synd. Chronic lower back pain, she would like a lumbar X ray done today. Negative for new associated symptoms.  Back Pain This is a chronic problem. The problem occurs constantly. The problem is unchanged. The pain is present in the lumbar spine. The quality of the pain is described as aching. The pain is at a severity of 7/10. The pain is moderate. The symptoms are aggravated by bending, twisting and position. Associated symptoms include numbness. Pertinent negatives include no abdominal pain, bladder incontinence, bowel incontinence, dysuria, fever, pelvic pain, perianal numbness, weakness or weight loss. The treatment provided mild relief.  Sometimes pain radiates to hips.  She would like to be screened for POTS She has done some online research and she has some of symptoms. For a while she has had toes numbness and tingling for a while, "issues with temp regulation": Feels "hot and cold." "Fainting spells",reports episodes of syncope during high school years.  + Palpitations,she has been evaluated in the ED (02/2021) and she has seen cardiologist, 04/2021. She has reported palpitations happening at night and at rest (03/07/21). It lasts a few seconds. No associated CP,SOB,or diaphoresis.  Echo in 04/2021:LVEF 59% Lab Results  Component Value Date   CREATININE 0.57 12/19/2020   BUN 11 12/19/2020   NA 139 12/19/2020   K 3.7 12/19/2020   CL 105 12/19/2020   CO2 23 12/19/2020   Lab Results  Component Value Date   TSH 1.17 03/21/2021   Lab Results  Component Value Date   WBC 8.6 12/19/2020   HGB 13.2 12/19/2020   HCT 38.9 12/19/2020   MCV 87.4 12/19/2020    PLT 258.0 12/19/2020   [facility] and [facility] was told she "definitively" should be evaluated for postural tachycardia synd.  B12 deficiency, she is on a daily multivitamin. She did not have B12 injections. Lab Results  Component Value Date   VITAMINB12 279 12/19/2020   Review of Systems  Constitutional:  Positive for fatigue. Negative for activity change, appetite change, fever and weight loss.  HENT:  Negative for mouth sores and sore throat.   Respiratory:  Negative for cough and wheezing.   Cardiovascular:  Negative for leg swelling.  Gastrointestinal:  Negative for abdominal pain, bowel incontinence and nausea.  Genitourinary:  Negative for bladder incontinence, dysuria and pelvic pain.  Musculoskeletal:  Positive for back pain and myalgias. Negative for gait problem.  Skin:  Negative for rash.  Neurological:  Positive for numbness. Negative for weakness.  Hematological:  Negative for adenopathy. Does not bruise/bleed easily.  Psychiatric/Behavioral:  Negative for confusion. The patient is nervous/anxious.   Rest see pertinent positives and negatives per HPI.  Current Outpatient Medications on File Prior to Visit  Medication Sig Dispense Refill   albuterol (VENTOLIN HFA) 108 (90 Base) MCG/ACT inhaler Inhale 2 puffs into the lungs every 4 (four) hours as needed for wheezing or shortness of breath. 8 g 1   No current facility-administered medications on file prior to visit.   Past Medical History:  Diagnosis Date   Anxiety    Asthma    Depression    History of psych admission at  age 26   No Known Allergies  Social History   Socioeconomic History   Marital status: Single    Spouse name: Not on file   Number of children: Not on file   Years of education: Not on file   Highest education level: Some college, no degree  Occupational History   Not on file  Tobacco Use   Smoking status: Never   Smokeless tobacco: Never  Vaping Use   Vaping  Use: Never used  Substance and Sexual Activity   Alcohol use: Yes    Comment: socially   Drug use: No   Sexual activity: Not on file  Other Topics Concern   Not on file  Social History Narrative   Student at [employer]   Good grades   Extracurricular activities - drama / theatre club   Social Determinants of Health   Financial Resource Strain: Medium Risk   Difficulty of Paying Living Expenses: Somewhat hard  Food Insecurity: Geophysicist/field seismologistood Insecurity Present   Worried About Programme researcher, broadcasting/film/videounning Out of Food in the Last Year: Often true   Baristaan Out of Food in the Last Year: Sometimes true  Transportation Needs: No Transportation Needs   Lack of Transportation (Medical): No   Lack of Transportation (Non-Medical): No  Physical Activity: Sufficiently Active   Days of Exercise per Week: 6 days   Minutes of Exercise per Session: 30 min  Stress: No Stress Concern Present   Feeling of Stress : Only a little  Social Connections: Moderately Isolated   Frequency of Communication with Friends and Family: Once a week   Frequency of Social Gatherings with Friends and Family: Twice a week   Attends Religious Services: Never   Database administratorActive Member of Clubs or Organizations: No   Attends BankerClub or Organization Meetings: Not on file   Marital Status: Living with partner    Vitals:   12/25/21 1510 12/25/21 1511  BP: 120/60 118/70  Pulse: 72 (!) 58  Resp:    SpO2:     Body mass index is 23.96 kg/m.  Physical Exam Vitals and nursing note reviewed.  Constitutional:      General: She is not in acute distress.    Appearance: She is well-developed and normal weight.  HENT:     Head: Normocephalic and atraumatic.  Eyes:     Conjunctiva/sclera: Conjunctivae normal.  Cardiovascular:     Rate and Rhythm: Normal rate and regular rhythm.     Heart sounds: No murmur heard. Pulmonary:     Effort: Pulmonary effort is normal. No respiratory distress.     Breath sounds: Normal breath sounds.  Abdominal:      Palpations: Abdomen is soft. There is no hepatomegaly or mass.     Tenderness: There is no abdominal tenderness.  Musculoskeletal:        General: No swelling or deformity.     Lumbar back: Tenderness present. No bony tenderness. Negative right straight leg raise test and negative left straight leg raise test.     Right hip: Tenderness present. No bony tenderness or crepitus. Normal range of motion.     Left hip: Tenderness present. No bony tenderness or crepitus. Normal range of motion.     Right lower leg: No edema.     Left lower leg: No edema.  Lymphadenopathy:     Cervical: No cervical adenopathy.  Skin:    General: Skin is warm.     Findings: No erythema or rash.  Neurological:     General:  No focal deficit present.     Mental Status: She is alert and oriented to person, place, and time.     Cranial Nerves: No cranial nerve deficit.     Gait: Gait normal.     Deep Tendon Reflexes:     Reflex Scores:      Patellar reflexes are 2+ on the right side and 2+ on the left side. Psychiatric:        Mood and Affect: Mood is anxious.   ASSESSMENT AND PLAN:  Vernadette was seen today for back pain and tested for pots.  Diagnoses and all orders for this visit:  Chronic bilateral low back pain, unspecified whether sciatica present Chronic and stable. Lumbar X ray will be obtained today as requested. Continue following with sport medicine/ortho.  Heart palpitations No orthostatic VS. No significant increased of HR after standing up for 10 min (HR 58/min, 7 min 64/min, 10 min 66/min). Instructed to monitor HR regularly. Cardiac monitor for a few days could provide useful information. She is not interested in following with cardio at this time. EKG in 04/2021 sinus arrhythmia. She does not want blood work or EKG repeated today. Instructed about warning signs.  B12 deficiency OTC B12 1000 mcg sublingual once per week recommended.  Numbness and tingling of both lower  extremities Chronic. We discussed possible etiologies. Could be part of fibromyalgia,caused by B12 def among some. She may benefit from having EMG done, she is not interested in neuro evaluation. Normal lumbar MRI in 12/2020.  I spent a total of 43 minutes in both face to face and non face to face activities for this visit on the date of this encounter. During this time history was obtained and documented, examination was performed, prior labs/imaging reviewed, and assessment/plan discussed.  Return if symptoms worsen or fail to improve.  Karlon Schlafer G. [place], MD  [facility]. [place] office.

## 2021-12-25 ENCOUNTER — Other Ambulatory Visit: Payer: Self-pay

## 2021-12-25 ENCOUNTER — Encounter: Payer: Self-pay | Admitting: Family Medicine

## 2021-12-25 ENCOUNTER — Ambulatory Visit (INDEPENDENT_AMBULATORY_CARE_PROVIDER_SITE_OTHER): Payer: BC Managed Care – PPO | Admitting: Family Medicine

## 2021-12-25 ENCOUNTER — Ambulatory Visit (INDEPENDENT_AMBULATORY_CARE_PROVIDER_SITE_OTHER): Payer: BC Managed Care – PPO

## 2021-12-25 VITALS — BP 118/70 | HR 58 | Resp 16 | Ht 67.0 in | Wt 153.0 lb

## 2021-12-25 DIAGNOSIS — R002 Palpitations: Secondary | ICD-10-CM | POA: Diagnosis not present

## 2021-12-25 DIAGNOSIS — M545 Low back pain, unspecified: Secondary | ICD-10-CM

## 2021-12-25 DIAGNOSIS — E538 Deficiency of other specified B group vitamins: Secondary | ICD-10-CM

## 2021-12-25 DIAGNOSIS — G8929 Other chronic pain: Secondary | ICD-10-CM | POA: Diagnosis not present

## 2021-12-25 DIAGNOSIS — R200 Anesthesia of skin: Secondary | ICD-10-CM

## 2021-12-25 DIAGNOSIS — R202 Paresthesia of skin: Secondary | ICD-10-CM

## 2021-12-25 NOTE — Patient Instructions (Addendum)
A few things to remember from today's visit: ? ?Chronic bilateral low back pain, unspecified whether sciatica present - Plan: DG Lumbar Spine Complete ? ?Heart palpitations ? ?If you need refills please call your pharmacy. ?Do not use My Chart to request refills or for acute issues that need immediate attention. ? ?Monitor heart rate regularly. ?Let me know if t=you are interested in neuro or cardio referral. ?B12 sublingual tabs 1000 mcg every Friday for B12 deficiency. ? ?Please be sure medication list is accurate. ?If a new problem present, please set up appointment sooner than planned today. ? ? ? ? ? ? ? ?

## 2022-01-05 ENCOUNTER — Encounter: Payer: Self-pay | Admitting: Physical Therapy

## 2022-01-05 ENCOUNTER — Ambulatory Visit: Payer: BC Managed Care – PPO | Admitting: Physical Therapy

## 2022-01-05 ENCOUNTER — Telehealth: Payer: Self-pay | Admitting: Physical Therapy

## 2022-01-05 NOTE — Telephone Encounter (Signed)
Called patient regarding her appt this AM . No answer and unable to leave a message on voicemail.  ? ?Karie Mainland, PT ?01/05/22 9:16 AM ?Phone: [phone] ?Fax: [phone]  ?

## 2022-01-05 NOTE — Therapy (Deleted)
?OUTPATIENT PHYSICAL THERAPY TREATMENT NOTE ? ? ?Patient Name: Mary Bright ?MRN: [id number] ?DOB:12/04/1995, 26 y.o., female ?Today's Date: 01/05/2022 ? ?PCP: [place]Jordan, Betty G, MD ?REFERRING PROVIDER: [place]ilandJordan, Betty G, MD ? ? ? ?Past Medical History:  ?Diagnosis Date  ? Anxiety   ? Asthma   ? Depression   ? History of psych admission at age 26  ? ?Past Surgical History:  ?Procedure Laterality Date  ? HAND SURGERY Right 2015  ? ?Patient Active Problem List  ? Diagnosis Date Noted  ? Fibromyalgia 04/05/2021  ? EDS (Ehlers-Danlos syndrome) Suspected 04/05/2021  ? GAD (generalized anxiety disorder) 07/07/2019  ? ADD (attention deficit disorder) 07/07/2019  ? Childhood asthma 03/31/2014  ? Enlarged lymph node 03/31/2014  ? Dysmenorrhea 03/31/2014  ? Depression 12/17/2011  ? ? ?REFERRING DIAG: neck pain, hypermobility syndrome  ? ?THERAPY DIAG:  ?Hypermobility, neck and back pain  ? ?PERTINENT HISTORY: Anxiety Asthma Depression  ? ?This is a chronic problem. The problem occurs constantly. The problem is unchanged. The pain is present in the lumbar spine. The quality of the pain is described as aching. The pain is at a severity of 7/10. The pain is moderate. The symptoms are aggravated by bending, twisting and position. Associated symptoms include numbness. Pertinent negatives include no abdominal pain, bladder incontinence, bowel incontinence, dysuria, fever, pelvic pain, perianal numbness, weakness or weight loss. The treatment provided mild relief.  ?Sometimes pain radiates to hips. ?  ?She would like to be screened for POTS She has done some online research and she has some of symptoms. ?For a while she has had toes numbness and tingling for a while, "issues with temp regulation": Feels "hot and cold." ?"Fainting spells",reports episodes of syncope during high school years.  ?+ Palpitations,she has been evaluated in the ED (02/2021) and she has seen cardiologist, 04/2021. ?She has reported palpitations happening at night  and at rest (03/07/21). ?It lasts a few seconds. ?No associated CP,SOB,or diaphoresis. ? ?PRECAUTIONS: hypermobility of joints, dysautonomia sx  ? ?SUBJECTIVE: *** ? ?PAIN:  ?Are you having pain? Yes: NPRS scale: ***/10 ?Pain location: *** ?Pain description: *** ?Aggravating factors: *** ?Relieving factors: *** ? ? ? ? ?[facility] TREAT[facility]  [facility]                                                DATE: 01[facility]05/22 ?Therapeutic Exercise: ?*** ?Manual Therapy: ?*** ?Neuromuscular re-ed: ?*** ?Therapeutic Activity: ?*** ?Modalities: ?*** ?Self Care: ?***  ? ? ?PATIENT EDUCATION: ?Education details: *** ?Person educated: {Person educated:25204} ?Education method: {Education Method:25205} ?Education comprehension: {Education Comprehension:25206} ? ? ?HOME EXERCISE PROGRAM: ?[email or web address] ? ?Access Code: [email or web address] ?URL: [email or web address] ?Date: 01/05/2022 ?Prepared by: Karie MainlandJennifer Lavonte Palos ? ?Exercises ?- Supine Shoulder Horizontal Abduction with Resistance  - 1 x daily - 7 x weekly - 3 sets - 10 reps ?- Supine Hip Adduction Isometric with Ball  - 1 x daily - 7 x weekly - 3 sets - 10 reps ?- Standing Bilateral Low Shoulder Row with Anchored Resistance  - 1 x daily - 7 x weekly - 3 sets - 10 reps ?- Shoulder extension with resistance - Neutral  - 1 x daily - 7 x weekly - 3 sets - 10 reps ?- Shoulder External Rotation and Scapular Retraction with Resistance  - 1 x daily - 7 x weekly - 3 sets - 10  reps ?- Seated Upper Trapezius Stretch  - 1 x daily - 7 x weekly - 3 sets - 10 reps ?- Supine Hip and Knee Flexion AROM with Swiss Ball  - 1 x daily - 7 x weekly - 3 sets - 10 reps ?- Supine Lower Trunk Rotation with Swiss Ball  - 1 x daily - 7 x weekly - 3 sets - 10 reps ?- Hamstring Set with Swiss Ball  - 1 x daily - 7 x weekly - 3 sets - 10 reps ?- Abdominal Press into Almyra  - 1 x daily - 7 x weekly - 3 sets - 10 reps ?- Engineer, petroleum with Swiss Ball  - 1 x daily - 7 x weekly - 3 sets - 10  reps ?- Hooklying Transversus Abdominis Palpation  - 1 x daily - 7 x weekly - 2 sets - 10 reps - 5 hold ?- Supine 90/90 Alternating Heel Touches with Posterior Pelvic Tilt  - 1 x daily - 7 x weekly - 2 sets - 10 reps ?- Supine Bridge  - 1 x daily - 7 x weekly - 3 sets - 10 reps ?- Hooklying Clamshell with Resistance  - 1 x daily - 7 x weekly - 3 sets - 10 reps ? ? PT Short Term Goals - 12/05/21 0958   ? ?  ? PT SHORT TERM GOAL #1  ? Title Patient will report a 50% reduction in pain during the day   ? Baseline still moves to different areas   ? Time 3   ? Period Weeks   ? Status On-going   ? Target Date 12/05/21   ?  ? PT SHORT TERM GOAL #2  ? Title Patient will be independnet with base strengthening program   ? Baseline she is progressing but we still have things to add   ? Time 3   ? Period Weeks   ? Status On-going   ? Target Date 12/05/21   ?  ? PT SHORT TERM GOAL #3  ? Title Patient will increase gross UE and LE strength to 5/5 or by 5 lbs   ? Baseline continue to monitor   ? Time 3   ? Period Weeks   ? Status On-going   ? Target Date 12/05/21   ? ?  ?  ? ?  ? ? ? PT Long Term Goals - 11/07/21 1615   ? ?  ? PT LONG TERM GOAL #1  ? 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 Baseline has more pain at work   ? Time 8   ? Period Weeks   ? Status On-going   ? Target Date 01/02/22   ?  ? PT LONG TERM GOAL #2  ? Title Patient will be indepdnent with a full long term strengthening program with a good idea of progression.   ? Baseline has not been able to progress much   ? Time 8   ? Period Weeks   ? Status On-going   ? Target Date 01/02/22   ?  ? PT LONG TERM GOAL #3  ? Title Patient will work through a day without increased upper back or neck pain   ? Baseline continues to have significant pain   ? Time 8   ? Period Weeks   ? Status On-going   ? Target Date 06/23/21   ? ?  ?  ? ?  ? ? ? ? ? ? ?  Martine Trageser, PT ?01/05/2022, 7:51 AM ? ?   ?

## 2022-01-11 NOTE — Therapy (Deleted)
?OUTPATIENT PHYSICAL THERAPY TREATMENT NOTE ? ? ?Patient Name: Mary Bright ?MRN: [id number] ?DOB:25-Feb-1996, 26 y.o., female ?Today's Date: 01/11/2022 ? ?PCP: [place], Betty G, MD ?REFERRING PROVIDER: [place], Betty G, MD, Dr. Clementeen Graham ? ? ? ? ?Past Medical History:  ?Diagnosis Date  ? Anxiety   ? Asthma   ? Depression   ? History of psych admission at age 42  ? ?Past Surgical History:  ?Procedure Laterality Date  ? HAND SURGERY Right 2015  ? ?Patient Active Problem List  ? Diagnosis Date Noted  ? Fibromyalgia 04/05/2021  ? EDS (Ehlers-Danlos syndrome) Suspected 04/05/2021  ? GAD (generalized anxiety disorder) 07/07/2019  ? ADD (attention deficit disorder) 07/07/2019  ? Childhood asthma 03/31/2014  ? Enlarged lymph node 03/31/2014  ? Dysmenorrhea 03/31/2014  ? Depression 12/17/2011  ? ? ?REFERRING DIAG: Ehlers-Danlos Syndrome (EDS), neck pain  ? ?THERAPY DIAG:  ?No diagnosis found. ? ?PERTINENT HISTORY: Anxiety Asthma Depression  ? ?PRECAUTIONS: EDS suspected , joint hypermobility  ? ?SUBJECTIVE: Patient transferred from [facility] to this location for more specialized therapy for EDS symptoms.  She has been off the Lyrica.  She has pain in mid to low back  ? ?PAIN:  ?Are you having pain? Yes ?NPRS scale: 6/10 ?Pain location: mid to low back  ?Pain orientation: Bilateral  ?PAIN TYPE: aching ?Pain description: constant and aching  ?Aggravating factors: activity, malposition  ?Relieving factors: rest, support to joint  ? ?Major joint involvement/complaints- neck, back and hips  ? ?History of dislocation/subluxations- Not that she recalls  ? ?What have you tried? What has or has not worked? Has done PT before for multiple issues  ? ?Do you know your triggers for pain, instability, other symptoms? ? ? ?Assessment (as on reg. Template) ?Beighton Scale: 8/9  ?Lumbar (1_/1) ?Knees (2_/2) ?Elbows (_2/2)  ?5th digit (_1/2) ?Thumb (2_/2) ?Comment on hips, shoulders  ? ? ?  ? ?TODAY'S TREATMENT:  ?Intro to new  clinic  ?Other medical/health professionals to help ?HEP, POC ? ?[facility] Adult PT Treatment:                                                DATE: 01/12/22 ?Therapeutic Exercise: ?*** ?Manual Therapy: ?*** ?Neuromuscular re-ed: ?*** ?Therapeutic Activity: ?*** ?Modalities: ?*** ?Self Care: ?***  ? ?[facility] Adult PT Treatment:                                                DATE: 12/22/21 ?Therapeutic Exercise: ?Neutral spine ?Pelvic tilt with abdominals  ?Tr A  ?March (reverse)  ?Clam  ?Prone knee bend with TrA  ?Self Care: ?See above ?Neutral spine, joint support  core stab.  ?Dr. Darrick Penna, rheumatology?  ? ?PATIENT EDUCATION: ?Education details: See above  ?Person educated: Patient ?Education method: Explanation, Demonstration, Verbal cues, and Handouts ?Education comprehension: verbalized understanding and needs further education ? ? ?HOME EXERCISE PROGRAM: ?[email or web address] ? ? ? PT Short Term Goals - 12/05/21 0958   ? ?  ? PT SHORT TERM GOAL #1  ? Title Patient will report a 50% reduction in pain during the day   ? Baseline Pain continues   ? Time 3   ? Period Weeks   ? Status On-going   ?  Target Date 01/19/22  ?  ? PT SHORT TERM GOAL #2  ? Title Patient will be independent with base strengthening program   ? Baseline Ongoing for joint stabilization  ? Time 3   ? Period Weeks   ? Status On-going   ? Target Date 01/19/22  ?  ? PT SHORT TERM GOAL #3  ? Title Patient will increase gross UE and LE strength to 5/5 or by 5 lbs   ? Baseline 4+/5 in shoulders and hips    ? Time 3   ? Period Weeks   ? Status On-going   ? Target Date 01/19/22  ? ?  ?  ? ?  ? ? ? PT Long Term Goals - 11/07/21 1615   ? ?  ? PT LONG TERM GOAL #1  ? Title Patient will pickup a 48 year old child with proper technique with no increase in pain   ? Baseline Cont to have pain  ? Time 8   ? Period Weeks   ? Status On-going   ? Target Date 02/16/22   ?  ? PT LONG TERM GOAL #2  ? Title Patient will be indepdnent with a full long term strengthening program with progression.   ?  Baseline has not been able to progress much   ? Time 8   ? Period Weeks   ? Status On-going   ? Target Date 02/16/22  ?  ? PT LONG TERM GOAL #3  ? Title Patient will work through a day without increased upper back or neck pain   ? Baseline continues to have significant pain   ? Time 8   ? Period Weeks   ? Status On-going   ? Target Date 02/16/22  ? ?  ?  ? ?  ? ? ? ? Plan - 12/05/21 7425   ?  ?  Clinical Impression Statement Patient here to this clinic to extend POC with more specialized and focus with a PT more familiar with EDS and treatment.  She did well today, given info and HEP for Transverse Abdominus and progression.  She shows instability in L spine, with spinal rotation with hip extension, hypermobility and decreased NM control of ROM. She will benefit from skilled PT to allow her to tolerate lifting loads and bending without exacerbating symptoms.   ?  Personal Factors and Comorbidities Time since onset of injury/illness/exacerbation;Comorbidity 1;Comorbidity 2   ?  Comorbidities fibromalgia, depression, possible Ehlers-Danlos syndrome though not formally diagnosed   ?  Examination-Activity Limitations Locomotion Level;Reach Overhead;Squat;Stand;Lift;Carry   ?  Examination-Participation Restrictions Cleaning;Occupation;Community Activity   ?  Stability/Clinical Decision Making Evolving/Moderate complexity   ?  Clinical Decision Making Moderate   ?  Rehab Potential Excellent   ?  PT Frequency 1x / week   ?  PT Duration 8 weeks   ?  PT Treatment/Interventions ADLs/Self Care Home Management;Electrical Stimulation;DME Instruction;Iontophoresis 4mg /ml Dexamethasone;Moist Heat;Traction;Therapeutic activities;Functional mobility training;Therapeutic exercise;Neuromuscular re-education;Patient/family education;Manual techniques;Passive range of motion;Dry needling;Taping , pilates   ?  ? ? ? ? ?Niurka Benecke, PT ?01/11/2022, 8:04 AM ? ?01/13/2022, PT ?01/11/22 8:04 AM ?Phone: [phone] ?Fax: [phone]  ?   ?

## 2022-01-12 ENCOUNTER — Ambulatory Visit: Payer: BC Managed Care – PPO | Admitting: Physical Therapy

## 2022-02-12 ENCOUNTER — Telehealth: Payer: BC Managed Care – PPO | Admitting: Family Medicine

## 2022-03-19 DIAGNOSIS — S1096XA Insect bite of unspecified part of neck, initial encounter: Secondary | ICD-10-CM | POA: Diagnosis not present

## 2022-03-19 DIAGNOSIS — S0086XA Insect bite (nonvenomous) of other part of head, initial encounter: Secondary | ICD-10-CM | POA: Diagnosis not present

## 2022-03-19 DIAGNOSIS — W57XXXA Bitten or stung by nonvenomous insect and other nonvenomous arthropods, initial encounter: Secondary | ICD-10-CM | POA: Diagnosis not present

## 2022-03-19 DIAGNOSIS — S0006XA Insect bite (nonvenomous) of scalp, initial encounter: Secondary | ICD-10-CM | POA: Diagnosis not present

## 2022-05-02 DIAGNOSIS — F4310 Post-traumatic stress disorder, unspecified: Secondary | ICD-10-CM | POA: Diagnosis not present

## 2022-05-09 DIAGNOSIS — F4310 Post-traumatic stress disorder, unspecified: Secondary | ICD-10-CM | POA: Diagnosis not present

## 2022-05-16 DIAGNOSIS — F4310 Post-traumatic stress disorder, unspecified: Secondary | ICD-10-CM | POA: Diagnosis not present

## 2022-05-23 DIAGNOSIS — F4310 Post-traumatic stress disorder, unspecified: Secondary | ICD-10-CM | POA: Diagnosis not present

## 2022-05-30 DIAGNOSIS — F4310 Post-traumatic stress disorder, unspecified: Secondary | ICD-10-CM | POA: Diagnosis not present

## 2022-06-04 ENCOUNTER — Ambulatory Visit: Payer: BC Managed Care – PPO | Admitting: Family

## 2022-06-04 ENCOUNTER — Telehealth: Payer: Self-pay | Admitting: Family Medicine

## 2022-06-04 NOTE — Telephone Encounter (Signed)
Noted.  Per patient's mychart response, "This isn't heart pain, it's rib and sternum pain, not an emergency."

## 2022-06-04 NOTE — Telephone Encounter (Signed)
Patient has declined triage for chest pain.  I have explained TOC procedures and scheduling of acute care needs to patient.   Patient states she has established with a few providers outside of [facility] since she was last seen by Betty Sw[place]and.  States she is not happy with the care Dr. Swaziland has provided.    Patient did not want to go through with TOC process.

## 2022-06-04 NOTE — Telephone Encounter (Signed)
Patient no longer wants to see Dr,Jordan. She wants to establish at [facility]. I instructed patient to call their office and schedule a TOC appointment. Phone number for their office was provided.     FYI

## 2022-06-04 NOTE — Telephone Encounter (Signed)
LVM to discuss chest pain symptoms and to find out if pt is wanting to transfer to our office from the [place] office.

## 2022-06-13 DIAGNOSIS — F4310 Post-traumatic stress disorder, unspecified: Secondary | ICD-10-CM | POA: Diagnosis not present

## 2022-06-20 DIAGNOSIS — F4310 Post-traumatic stress disorder, unspecified: Secondary | ICD-10-CM | POA: Diagnosis not present

## 2022-07-13 ENCOUNTER — Telehealth: Payer: Self-pay | Admitting: Family Medicine

## 2022-07-13 NOTE — Telephone Encounter (Signed)
Pt call and want to transfer from dr.Jordan to [facility]

## 2022-07-14 NOTE — Telephone Encounter (Signed)
Fine with me. BJ 

## 2022-07-17 NOTE — Telephone Encounter (Signed)
Patient has gotten ok from Dr. Martinique to transfer to a new provider.  Is it ok with Dr. Legrand Como to take patient on as a TOC?

## 2022-07-18 DIAGNOSIS — F4310 Post-traumatic stress disorder, unspecified: Secondary | ICD-10-CM | POA: Diagnosis not present

## 2022-07-18 NOTE — Telephone Encounter (Signed)
Sure no problem

## 2022-07-25 ENCOUNTER — Telehealth: Payer: Self-pay | Admitting: Family Medicine

## 2022-07-25 DIAGNOSIS — F4310 Post-traumatic stress disorder, unspecified: Secondary | ICD-10-CM | POA: Diagnosis not present

## 2022-07-25 NOTE — Telephone Encounter (Signed)
Patient requests TOC from Betty Martinique to [facility].  Please advise.

## 2022-07-26 NOTE — Telephone Encounter (Signed)
Pt does not want  TOC to any provider at [facility] at [place] and would go to [place] location

## 2022-07-26 NOTE — Telephone Encounter (Signed)
Patient states she is not established [facility]. Was not aware of appointment with Mary Bright.  Patient was informed that Mary Bright at [facility] accepted TOC from Betty Martinique and that [facility] will honor that request.  Patient was advised to follow up with [facility].

## 2022-07-31 ENCOUNTER — Encounter: Payer: Self-pay | Admitting: Family Medicine

## 2022-07-31 ENCOUNTER — Ambulatory Visit: Payer: BC Managed Care – PPO | Admitting: Family Medicine

## 2022-07-31 ENCOUNTER — Ambulatory Visit (INDEPENDENT_AMBULATORY_CARE_PROVIDER_SITE_OTHER): Payer: BC Managed Care – PPO | Admitting: Family Medicine

## 2022-07-31 VITALS — BP 112/60 | HR 63 | Temp 99.4°F | Ht 67.0 in | Wt 168.5 lb

## 2022-07-31 DIAGNOSIS — Z1159 Encounter for screening for other viral diseases: Secondary | ICD-10-CM | POA: Diagnosis not present

## 2022-07-31 DIAGNOSIS — M2550 Pain in unspecified joint: Secondary | ICD-10-CM | POA: Diagnosis not present

## 2022-07-31 DIAGNOSIS — R200 Anesthesia of skin: Secondary | ICD-10-CM | POA: Diagnosis not present

## 2022-07-31 DIAGNOSIS — R7303 Prediabetes: Secondary | ICD-10-CM | POA: Diagnosis not present

## 2022-07-31 DIAGNOSIS — K648 Other hemorrhoids: Secondary | ICD-10-CM

## 2022-07-31 DIAGNOSIS — R202 Paresthesia of skin: Secondary | ICD-10-CM | POA: Diagnosis not present

## 2022-07-31 DIAGNOSIS — N921 Excessive and frequent menstruation with irregular cycle: Secondary | ICD-10-CM | POA: Diagnosis not present

## 2022-07-31 DIAGNOSIS — R7989 Other specified abnormal findings of blood chemistry: Secondary | ICD-10-CM

## 2022-07-31 LAB — COMPREHENSIVE METABOLIC PANEL
ALT: 16 U/L (ref 0–35)
AST: 17 U/L (ref 0–37)
Albumin: 4.9 g/dL (ref 3.5–5.2)
Alkaline Phosphatase: 85 U/L (ref 39–117)
BUN: 11 mg/dL (ref 6–23)
CO2: 27 mEq/L (ref 19–32)
Calcium: 9.4 mg/dL (ref 8.4–10.5)
Chloride: 102 mEq/L (ref 96–112)
Creatinine, Ser: 0.59 mg/dL (ref 0.40–1.20)
GFR: 125.74 mL/min (ref >60.00)
Glucose, Bld: 95 mg/dL (ref 70–99)
Potassium: 4 mEq/L (ref 3.5–5.1)
Sodium: 136 mEq/L (ref 135–145)
Total Bilirubin: 0.4 mg/dL (ref 0.2–1.2)
Total Protein: 7.7 g/dL (ref 6.0–8.3)

## 2022-07-31 LAB — CBC WITH DIFFERENTIAL/PLATELET
Basophils Absolute: 0 10*3/uL (ref 0.0–0.1)
Basophils Relative: 0.3 % (ref 0.0–3.0)
Eosinophils Absolute: 0.1 10*3/uL (ref 0.0–0.7)
Eosinophils Relative: 1.1 % (ref 0.0–5.0)
HCT: 39.9 % (ref 36.0–46.0)
Hemoglobin: 13.2 g/dL (ref 12.0–15.0)
Lymphocytes Relative: 27.9 % (ref 12.0–46.0)
Lymphs Abs: 2.3 10*3/uL (ref 0.7–4.0)
MCHC: 33.1 g/dL (ref 30.0–36.0)
MCV: 85.7 fl (ref 78.0–100.0)
Monocytes Absolute: 0.6 10*3/uL (ref 0.1–1.0)
Monocytes Relative: 7.8 % (ref 3.0–12.0)
Neutro Abs: 5.2 10*3/uL (ref 1.4–7.7)
Neutrophils Relative %: 62.9 % (ref 43.0–77.0)
Platelets: 288 10*3/uL (ref 150.0–400.0)
RBC: 4.65 Mil/uL (ref 3.87–5.11)
RDW: 13.9 % (ref 11.5–15.5)
WBC: 8.3 10*3/uL (ref 4.0–10.5)

## 2022-07-31 LAB — TSH: TSH: 1.68 u[IU]/mL (ref 0.35–5.50)

## 2022-07-31 LAB — HEMOGLOBIN A1C: Hgb A1c MFr Bld: 5.7 % (ref 4.6–6.5)

## 2022-07-31 LAB — T3, FREE: T3, Free: 3.9 pg/mL (ref 2.3–4.2)

## 2022-07-31 LAB — T4, FREE: Free T4: 0.71 ng/dL (ref 0.60–1.60)

## 2022-07-31 LAB — SEDIMENTATION RATE: Sed Rate: 14 mm/hr (ref 0–20)

## 2022-07-31 LAB — VITAMIN B12: Vitamin B-12: 499 pg/mL (ref 211–911)

## 2022-07-31 NOTE — Telephone Encounter (Signed)
Pt is scheduled to see Dr. Cherlynn Kaiser on 07/31/22 for TOC.

## 2022-07-31 NOTE — Progress Notes (Signed)
Subjective:     Patient ID: Mary Bright, female    DOB: 31-May-1996, 26 y.o.   MRN: [id number]  Chief Complaint  Patient presents with   Transfer of Care    HPI Toc Vapes delta 8  Hemorrhoids-has to push back in at times.  Bm daily or more.  Soft.  No straining.  No pain.  Occ blood.  There all the time but occ uses creams and pushes back.  Gpa had surgery on his twice.   Scoliosis by x-ray-2022.  Did PT for 2 mo for chronic pain.  Tingling under surface of L upper arm-can be at rest.  Pt R handed.  Going on for 2-3 mo.  Not daily.  Can last few minutes.  Can recur or go days w/o.    Long time neck pain at times.  Shoulder pain-long time.   Fingers and toes can hurt at times and feel "asleep but not asleep".  No swelling. Poss hot at times.  Thinks may be dislocating shoulders while asleep at times.  Heavy, irreg periods.  Recently, spotting brown blood for 2 wks and then very heavy period.  Preg test neg.  Still getting cramps freq even not on menses and now more acne.  Has appt w/gyn next wk.    Health Maintenance Due  Topic Date Due   Hepatitis C Screening  Never done    Past Medical History:  Diagnosis Date   Anxiety    Asthma    Depression    History of psych admission at age 77   Fibromyalgia     Past Surgical History:  Procedure Laterality Date   HAND SURGERY Right 2015    Outpatient Medications Prior to Visit  Medication Sig Dispense Refill   albuterol (VENTOLIN HFA) 108 (90 Base) MCG/ACT inhaler Inhale 2 puffs into the lungs every 4 (four) hours as needed for wheezing or shortness of breath. 8 g 1   Hydrocortisone, Perianal, 1 % CREA Apply topically 2 (two) times daily. (Patient not taking: Reported on 07/31/2022)     No facility-administered medications prior to visit.    No Known Allergies ROS neg/noncontributory except as noted HPI/below Palp at times-Card in past.  No cafeine.  If drinks too much sugar, feels palp.      Objective:     BP 112/60    Pulse 63   Temp 99.4 F (37.4 C) (Temporal)   Ht 5\' 7"  (1.702 m)   Wt 168 lb 8 oz (76.4 kg)   LMP 07/19/2022 (Approximate)   SpO2 99%   BMI 26.39 kg/m  Wt Readings from Last 3 Encounters:  07/31/22 168 lb 8 oz (76.4 kg)  12/25/21 153 lb (69.4 kg)  07/05/21 159 lb 12.8 oz (72.5 kg)    Physical Exam   Gen: WDWN NAD wf HEENT: NCAT, conjunctiva not injected, sclera nonicteric NECK:  supple, no thyromegaly, no nodes, no carotid bruits CARDIAC: RRR, S1S2+, no murmur. DP 2+B LUNGS: CTAB. No wheezes ABDOMEN:  BS+, soft, NTND, No HSM, no masses EXT:  no edema MSK: no gross abnormalities. Hands-squeeze test neg-no swelling, no TTP joints.  Neck-neg spurling.  Upper L arm-no ttp.  No ttp shoulder.  No axillary nodes.  NEURO: A&O x3.  CN II-XII intact.  PSYCH: normal mood. Good eye contact  Reviewed x-rays and MRI with patient.  Minimal scoliosis which may have been positional as well.  No acute processes in any of the scans nor chronic.  Assessment & Plan:   Problem List Items Addressed This Visit   None Visit Diagnoses     Numbness and tingling of both lower extremities    -  Primary   Relevant Orders   Vitamin B12 (Completed)   Menorrhagia with irregular cycle       Relevant Orders   Comprehensive metabolic panel (Completed)   CBC with Differential/Platelet (Completed)   Prediabetes       Relevant Orders   Comprehensive metabolic panel (Completed)   Hemoglobin A1c (Completed)   Abnormal TSH       Relevant Orders   TSH (Completed)   T3, free (Completed)   T4, free (Completed)   Thyroid Peroxidase Antibodies (TPO) (REFL)   Arthralgia, unspecified joint       Relevant Orders   Sedimentation rate (Completed)   Rheumatoid factor   Encounter for hepatitis C screening test for low risk patient       Relevant Orders   Hepatitis C antibody   Other hemorrhoids         1.  Hemorrhoids-chronic.  Due to time constraints, we did not examine this area.  Will do at next  visit and determine course of action.  Check CBC to rule out anemia. 2.  Numbness tingling hands and feet-check vitamin B12. 3.  Prediabetes-work on diet/exercise to prevent progression to diabetes.  Check CMP, A1c 4.  Abnormal TSH-check TSH, free T3, free T4, TPO 5.  Arthralgias-multiple joints-check sed rate and rheumatoid factor. 6.  Numbness left upper arm-question if nerve impingement, other.  We will see what labs show and readdress at next visit. 7.  Heavy menses with irregular cycles-question PCOS, other.  She has an appointment next week with OB/GYN.  Check TSH, CBC, CMP, A1c.  Defer further work-up/treatment/plan to GYN  No orders of the defined types were placed in this encounter.   [name], MD

## 2022-07-31 NOTE — Patient Instructions (Signed)

## 2022-08-01 DIAGNOSIS — F4310 Post-traumatic stress disorder, unspecified: Secondary | ICD-10-CM | POA: Diagnosis not present

## 2022-08-01 LAB — HEPATITIS C ANTIBODY: Hepatitis C Ab: NONREACTIVE

## 2022-08-01 LAB — RHEUMATOID FACTOR: Rheumatoid fact SerPl-aCnc: <14 IU/mL (ref <14)

## 2022-08-01 LAB — THYROID PEROXIDASE ANTIBODIES (TPO) (REFL): Thyroperoxidase Ab SerPl-aCnc: 2 IU/mL (ref <9)

## 2022-08-08 DIAGNOSIS — F4310 Post-traumatic stress disorder, unspecified: Secondary | ICD-10-CM | POA: Diagnosis not present

## 2022-08-08 DIAGNOSIS — N939 Abnormal uterine and vaginal bleeding, unspecified: Secondary | ICD-10-CM | POA: Diagnosis not present

## 2022-08-08 DIAGNOSIS — Z124 Encounter for screening for malignant neoplasm of cervix: Secondary | ICD-10-CM | POA: Diagnosis not present

## 2022-08-08 DIAGNOSIS — Z113 Encounter for screening for infections with a predominantly sexual mode of transmission: Secondary | ICD-10-CM | POA: Diagnosis not present

## 2022-08-08 DIAGNOSIS — N920 Excessive and frequent menstruation with regular cycle: Secondary | ICD-10-CM | POA: Diagnosis not present

## 2022-08-10 ENCOUNTER — Ambulatory Visit (INDEPENDENT_AMBULATORY_CARE_PROVIDER_SITE_OTHER): Payer: BC Managed Care – PPO | Admitting: Family Medicine

## 2022-08-10 ENCOUNTER — Encounter: Payer: Self-pay | Admitting: Family Medicine

## 2022-08-10 VITALS — BP 120/60 | HR 77 | Temp 98.8°F | Ht 67.0 in | Wt 172.4 lb

## 2022-08-10 DIAGNOSIS — K648 Other hemorrhoids: Secondary | ICD-10-CM | POA: Diagnosis not present

## 2022-08-10 DIAGNOSIS — M5412 Radiculopathy, cervical region: Secondary | ICD-10-CM

## 2022-08-10 DIAGNOSIS — M25512 Pain in left shoulder: Secondary | ICD-10-CM

## 2022-08-10 DIAGNOSIS — R0781 Pleurodynia: Secondary | ICD-10-CM | POA: Diagnosis not present

## 2022-08-10 DIAGNOSIS — H6121 Impacted cerumen, right ear: Secondary | ICD-10-CM | POA: Diagnosis not present

## 2022-08-10 DIAGNOSIS — G8929 Other chronic pain: Secondary | ICD-10-CM

## 2022-08-10 MED ORDER — HYDROCORTISONE ACETATE 25 MG RE SUPP: 25.0000 mg | Freq: Two times a day (BID) | RECTAL | 0 refills | Status: AC

## 2022-08-10 MED ORDER — AMOXICILLIN 875 MG PO TABS: 875.0000 mg | ORAL_TABLET | Freq: Two times a day (BID) | ORAL | 0 refills | Status: AC

## 2022-08-10 NOTE — Patient Instructions (Signed)
It was very nice to see you today!  get X-ray/labs at [facility].  [address] [place]  hours 8=M-F 8:30-5.  closed 12:30-1 lunch   Referral in for surgeon.  Take the anusol suppositories.   PLEASE NOTE:  If you had any lab tests please let us know if you have not heard back within a few days. You may see your results on MyChart before we have a chance to review them but we will give you a call once they are reviewed by [place]. If we ordered any referrals today, please let us know if you have not heard from their office within the next week.   Please try these tips to maintain a healthy lifestyle:  Eat most of your calories during the day when you are active. Eliminate processed foods including packaged sweets (pies, cakes, cookies), reduce intake of potatoes, white bread, white pasta, and white rice. Look for whole grain options, oat flour or almond flour.  Each meal should contain half fruits/vegetables, one quarter protein, and one quarter carbs (no bigger than a computer mouse).  Cut down on sweet beverages. This includes juice, soda, and sweet tea. Also watch fruit intake, though this is a healthier sweet option, it still contains natural sugar! Limit to 3 servings daily.  Drink at least 1 glass of water with each meal and aim for at least 8 glasses per day  Exercise at least 150 minutes every week.

## 2022-08-10 NOTE — Progress Notes (Signed)
Subjective:     Patient ID: Mary Bright, female    DOB: 1996-03-04, 26 y.o.   MRN: [id number]  Chief Complaint  Patient presents with   Follow-up    2 week follow-up on hemorrhoids and tingling      HPI Hemorrhoids-just started very heavy period so not wanting exam.occ pain/itch/pressure  L upper arm numbness at times. Underneath upper arm.  Saw gyn-getting u/s next wk.    Lower ribs B in front tender. Long time  intermitt  Ears "thumping" at times.   There are no preventive care reminders to display for this patient.  Past Medical History:  Diagnosis Date   Anxiety    Asthma    Depression    History of psych admission at age 38   Fibromyalgia     Past Surgical History:  Procedure Laterality Date   HAND SURGERY Right 2015    Outpatient Medications Prior to Visit  Medication Sig Dispense Refill   albuterol (VENTOLIN HFA) 108 (90 Base) MCG/ACT inhaler Inhale 2 puffs into the lungs every 4 (four) hours as needed for wheezing or shortness of breath. 8 g 1   No facility-administered medications prior to visit.    No Known Allergies ROS neg/noncontributory except as noted HPI/below      Objective:     BP 120/60   Pulse 77   Temp 98.8 F (37.1 C) (Temporal)   Ht 5\' 7"  (1.702 m)   Wt 172 lb 6 oz (78.2 kg)   LMP 07/19/2022 (Approximate)   SpO2 99%   BMI 27.00 kg/m  Wt Readings from Last 3 Encounters:  08/10/22 172 lb 6 oz (78.2 kg)  07/31/22 168 lb 8 oz (76.4 kg)  12/25/21 153 lb (69.4 kg)    Physical Exam   Gen: WDWN NAD HEENT: NCAT, conjunctiva not injected, sclera nonicteric L ear normal.  R-wax. NECK:  supple, no thyromegaly, no nodes ABDOMEN:  BS+, soft, NTND, No HSM, no masses EXT:  no edema MSK: no gross abnormalities.  No TTP upper spine/neck.  Muscles not too tight.  No TTP L shoulder/axilla.  Spurling neg.  NEURO: A&O x3.  CN II-XII intact.  PSYCH: normal mood. Good eye contact  Procedure:  verbal informed consent obtained.  Irrig  R ear w/water and H2O2- wax removed. R TM red.  Pt tolerated well     Assessment & Plan:   Problem List Items Addressed This Visit   None Visit Diagnoses     Cervical radiculopathy    -  Primary   Relevant Orders   DG Cervical Spine Complete   Chronic left shoulder pain       Relevant Orders   DG Shoulder Left   Rib pain       Relevant Orders   DG Ribs Bilateral W/Chest   Other hemorrhoids       Relevant Orders   Ambulatory referral to General Surgery   Impacted cerumen of right ear          Cervical radiculopathy-check x-ray c spine.  Pain/numbness to LUE.may need MRI L shoulder pain/numbness-check shoulder x-ray Rib pain-chronic.  Lower ribs-check films. Hemorrhoids-prolapsing.  Anusol hc supp(but this is chronic).  Refer gen surg Cerumen impaction R ear-irrig, resolved.  TM really red.  Will tx for OM.  Amox 875 bid x 7d.   F/u 2 mo-on-going pain/problems  Meds ordered this encounter  Medications   hydrocortisone (ANUSOL-HC) 25 MG suppository    Sig: Place 1 suppository (25  mg total) rectally 2 (two) times daily.    Dispense:  12 suppository    Refill:  0   amoxicillin (AMOXIL) 875 MG tablet    Sig: Take 1 tablet (875 mg total) by mouth 2 (two) times daily.    Dispense:  14 tablet    Refill:  0    Wellington Hampshire, MD

## 2022-08-14 DIAGNOSIS — N939 Abnormal uterine and vaginal bleeding, unspecified: Secondary | ICD-10-CM | POA: Diagnosis not present

## 2022-08-14 DIAGNOSIS — N946 Dysmenorrhea, unspecified: Secondary | ICD-10-CM | POA: Diagnosis not present

## 2022-08-22 DIAGNOSIS — F4310 Post-traumatic stress disorder, unspecified: Secondary | ICD-10-CM | POA: Diagnosis not present

## 2022-08-29 DIAGNOSIS — F4310 Post-traumatic stress disorder, unspecified: Secondary | ICD-10-CM | POA: Diagnosis not present

## 2022-09-12 DIAGNOSIS — F4310 Post-traumatic stress disorder, unspecified: Secondary | ICD-10-CM | POA: Diagnosis not present

## 2022-09-19 DIAGNOSIS — F4310 Post-traumatic stress disorder, unspecified: Secondary | ICD-10-CM | POA: Diagnosis not present

## 2022-09-20 ENCOUNTER — Ambulatory Visit: Payer: BC Managed Care – PPO | Admitting: Adult Health

## 2022-10-17 DIAGNOSIS — F4310 Post-traumatic stress disorder, unspecified: Secondary | ICD-10-CM | POA: Diagnosis not present

## 2022-10-31 DIAGNOSIS — F4310 Post-traumatic stress disorder, unspecified: Secondary | ICD-10-CM | POA: Diagnosis not present

## 2022-11-14 DIAGNOSIS — F4310 Post-traumatic stress disorder, unspecified: Secondary | ICD-10-CM | POA: Diagnosis not present

## 2022-11-21 DIAGNOSIS — F4310 Post-traumatic stress disorder, unspecified: Secondary | ICD-10-CM | POA: Diagnosis not present

## 2023-01-30 DIAGNOSIS — F4310 Post-traumatic stress disorder, unspecified: Secondary | ICD-10-CM | POA: Diagnosis not present

## 2023-02-09 IMAGING — DX DG CERVICAL SPINE 2 OR 3 VIEWS
4 series · 4 of 4 positions shown · non-contrast
Comparison: None.

CLINICAL DATA: Neck pain

EXAM:
CERVICAL SPINE - 2-3 VIEW

[c-spine lat]
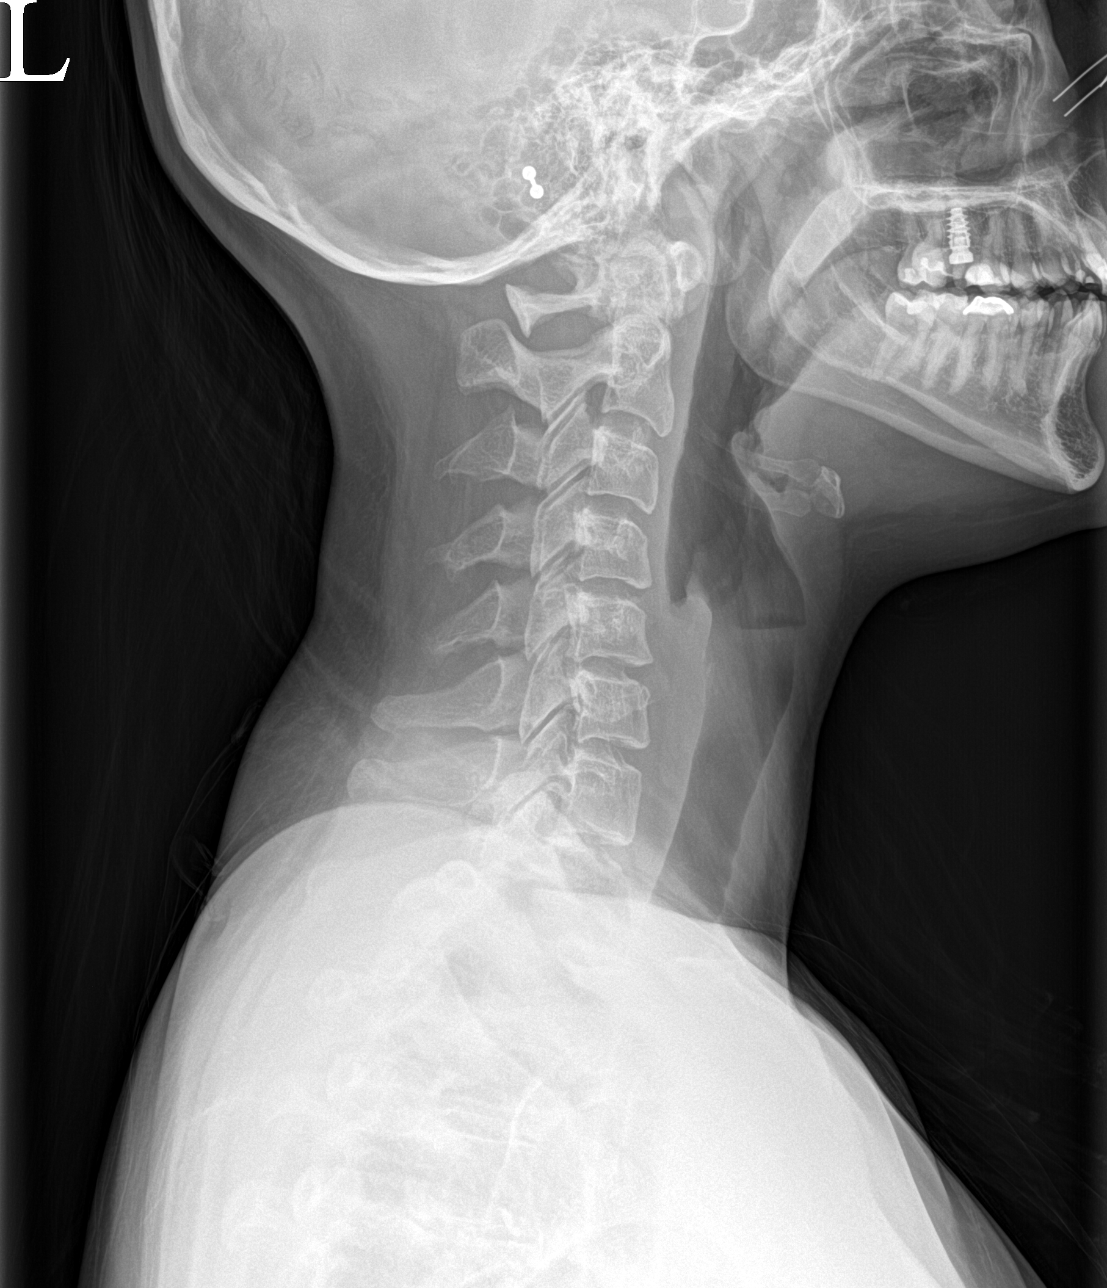

[c-spine ap]
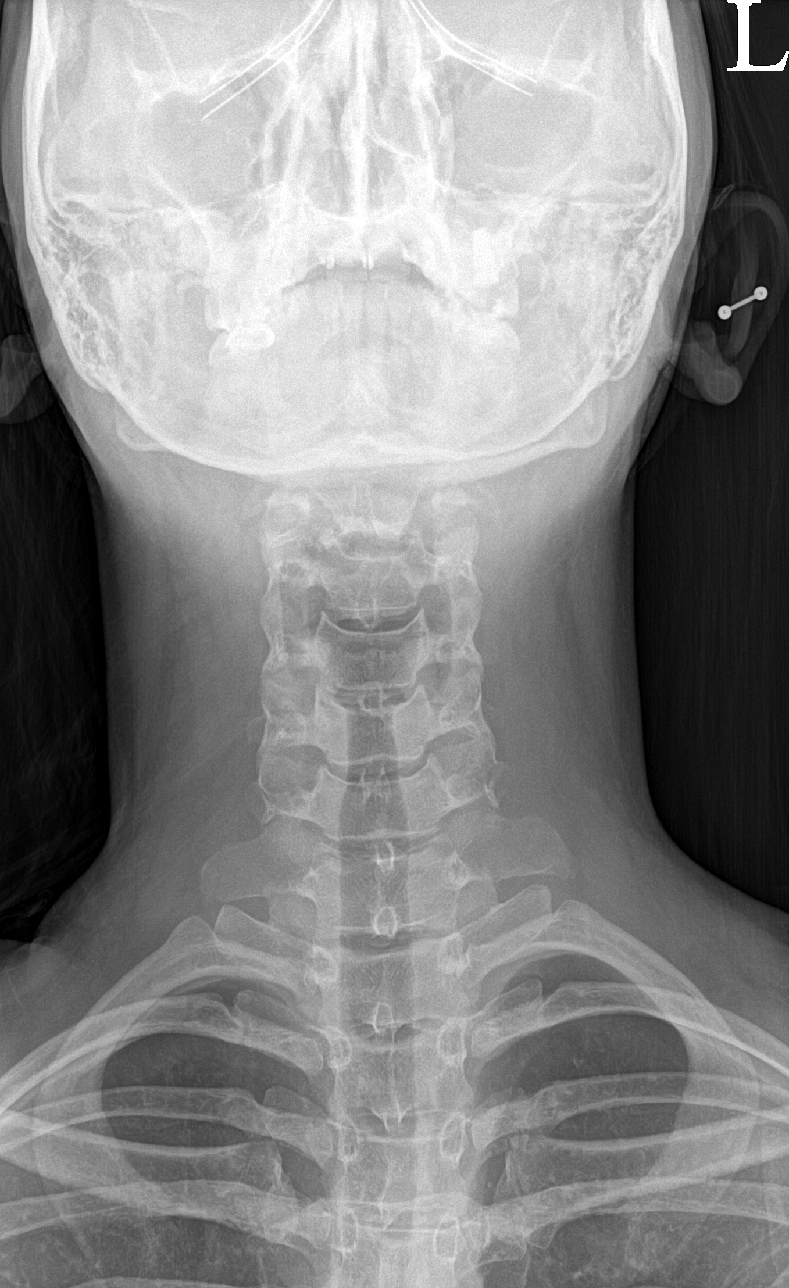

[c-spine open mouth (1 of 2)]
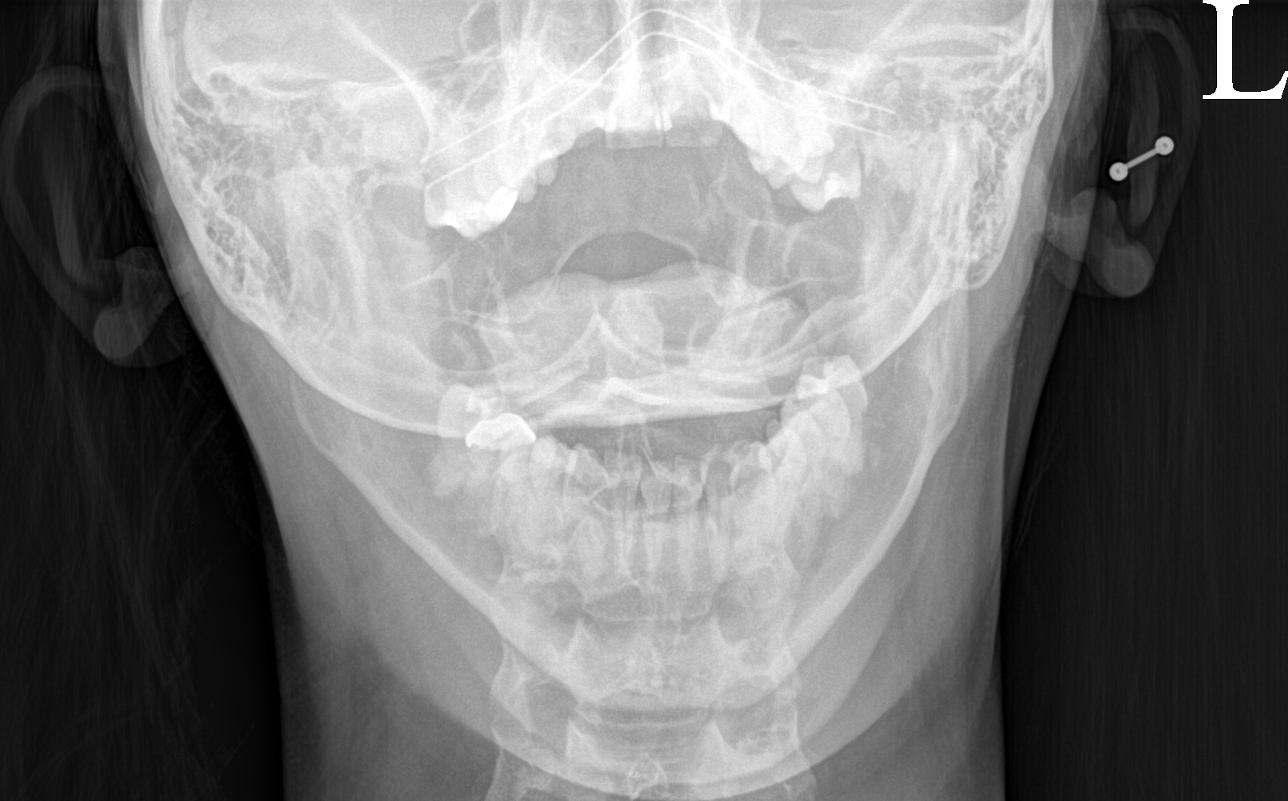

[c-spine open mouth (2 of 2)]
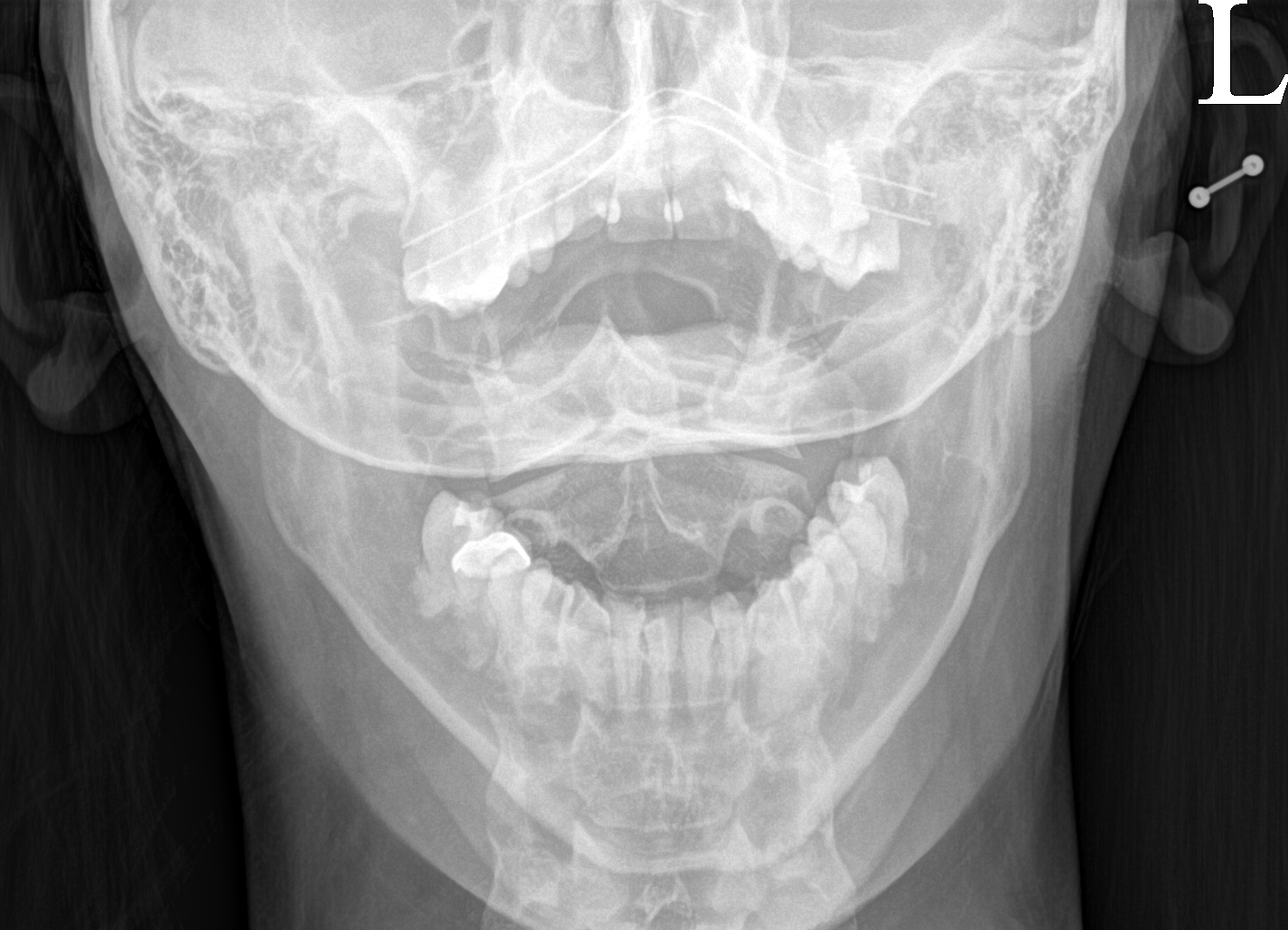

[4 of 4 positions shown; findings below may reference images not displayed]

FINDINGS: Mild reversal of cervical lordosis. Vertebral body heights are
maintained. Disc spaces appear normal. Dens and lateral masses are
within normal limits
IMPRESSION: Mild reversal of cervical lordosis.

## 2023-02-13 DIAGNOSIS — F4310 Post-traumatic stress disorder, unspecified: Secondary | ICD-10-CM | POA: Diagnosis not present

## 2023-03-19 DIAGNOSIS — F4310 Post-traumatic stress disorder, unspecified: Secondary | ICD-10-CM | POA: Diagnosis not present

## 2023-04-10 DIAGNOSIS — F4310 Post-traumatic stress disorder, unspecified: Secondary | ICD-10-CM | POA: Diagnosis not present

## 2023-04-15 DIAGNOSIS — Z111 Encounter for screening for respiratory tuberculosis: Secondary | ICD-10-CM | POA: Diagnosis not present

## 2023-04-17 DIAGNOSIS — Z111 Encounter for screening for respiratory tuberculosis: Secondary | ICD-10-CM | POA: Diagnosis not present

## 2023-04-17 DIAGNOSIS — Z681 Body mass index (BMI) 19 or less, adult: Secondary | ICD-10-CM | POA: Diagnosis not present

## 2023-04-24 DIAGNOSIS — F4310 Post-traumatic stress disorder, unspecified: Secondary | ICD-10-CM | POA: Diagnosis not present

## 2023-05-22 DIAGNOSIS — F4310 Post-traumatic stress disorder, unspecified: Secondary | ICD-10-CM | POA: Diagnosis not present

## 2023-05-28 DIAGNOSIS — F4310 Post-traumatic stress disorder, unspecified: Secondary | ICD-10-CM | POA: Diagnosis not present

## 2023-06-05 DIAGNOSIS — F4310 Post-traumatic stress disorder, unspecified: Secondary | ICD-10-CM | POA: Diagnosis not present

## 2023-06-12 DIAGNOSIS — F4310 Post-traumatic stress disorder, unspecified: Secondary | ICD-10-CM | POA: Diagnosis not present

## 2023-06-19 DIAGNOSIS — F4310 Post-traumatic stress disorder, unspecified: Secondary | ICD-10-CM | POA: Diagnosis not present

## 2023-07-17 DIAGNOSIS — F4310 Post-traumatic stress disorder, unspecified: Secondary | ICD-10-CM | POA: Diagnosis not present

## 2023-07-31 DIAGNOSIS — F4310 Post-traumatic stress disorder, unspecified: Secondary | ICD-10-CM | POA: Diagnosis not present

## 2023-08-07 DIAGNOSIS — F4310 Post-traumatic stress disorder, unspecified: Secondary | ICD-10-CM | POA: Diagnosis not present

## 2023-08-14 DIAGNOSIS — F4310 Post-traumatic stress disorder, unspecified: Secondary | ICD-10-CM | POA: Diagnosis not present

## 2023-09-04 DIAGNOSIS — F4310 Post-traumatic stress disorder, unspecified: Secondary | ICD-10-CM | POA: Diagnosis not present

## 2023-09-25 DIAGNOSIS — F4310 Post-traumatic stress disorder, unspecified: Secondary | ICD-10-CM | POA: Diagnosis not present
